# Patient Record
Sex: Female | Born: 1950 | Race: White | Hispanic: No | Marital: Married | Smoking: Former smoker
Health system: Southern US, Community
[De-identification: ages and names within clinical notes are randomized; demographics above are authoritative.]

## PROBLEM LIST (undated history)

## (undated) DIAGNOSIS — Z953 Presence of xenogenic heart valve: Secondary | ICD-10-CM

## (undated) DIAGNOSIS — M797 Fibromyalgia: Secondary | ICD-10-CM

## (undated) DIAGNOSIS — Z87820 Personal history of traumatic brain injury: Secondary | ICD-10-CM

## (undated) DIAGNOSIS — K589 Irritable bowel syndrome without diarrhea: Secondary | ICD-10-CM

## (undated) DIAGNOSIS — Z8719 Personal history of other diseases of the digestive system: Secondary | ICD-10-CM

## (undated) DIAGNOSIS — Z8774 Personal history of (corrected) congenital malformations of heart and circulatory system: Secondary | ICD-10-CM

## (undated) DIAGNOSIS — Z86010 Personal history of colonic polyps: Secondary | ICD-10-CM

## (undated) DIAGNOSIS — K219 Gastro-esophageal reflux disease without esophagitis: Secondary | ICD-10-CM

## (undated) DIAGNOSIS — Z859 Personal history of malignant neoplasm, unspecified: Secondary | ICD-10-CM

## (undated) DIAGNOSIS — N1330 Unspecified hydronephrosis: Secondary | ICD-10-CM

## (undated) DIAGNOSIS — E039 Hypothyroidism, unspecified: Secondary | ICD-10-CM

## (undated) DIAGNOSIS — Z9889 Other specified postprocedural states: Secondary | ICD-10-CM

## (undated) DIAGNOSIS — L309 Dermatitis, unspecified: Secondary | ICD-10-CM

## (undated) DIAGNOSIS — K760 Fatty (change of) liver, not elsewhere classified: Secondary | ICD-10-CM

## (undated) DIAGNOSIS — F418 Other specified anxiety disorders: Secondary | ICD-10-CM

## (undated) DIAGNOSIS — E739 Lactose intolerance, unspecified: Secondary | ICD-10-CM

## (undated) DIAGNOSIS — E785 Hyperlipidemia, unspecified: Secondary | ICD-10-CM

## (undated) DIAGNOSIS — M1990 Unspecified osteoarthritis, unspecified site: Secondary | ICD-10-CM

## (undated) DIAGNOSIS — N200 Calculus of kidney: Secondary | ICD-10-CM

## (undated) DIAGNOSIS — S46001A Unspecified injury of muscle(s) and tendon(s) of the rotator cuff of right shoulder, initial encounter: Secondary | ICD-10-CM

## (undated) HISTORY — PX: TISSUE AORTIC VALVE REPLACEMENT: SHX2527

## (undated) HISTORY — PX: COLONOSCOPY: SHX174

## (undated) HISTORY — DX: Personal history of colonic polyps: Z86.010

## (undated) HISTORY — DX: Irritable bowel syndrome, unspecified: K58.9

## (undated) HISTORY — DX: Hyperlipidemia, unspecified: E78.5

## (undated) HISTORY — PX: TRANSTHORACIC ECHOCARDIOGRAM: SHX275

## (undated) HISTORY — DX: Fatty (change of) liver, not elsewhere classified: K76.0

## (undated) HISTORY — DX: Calculus of kidney: N20.0

## (undated) HISTORY — PX: EXTRACORPOREAL SHOCK WAVE LITHOTRIPSY: SHX1557

## (undated) HISTORY — DX: Gastro-esophageal reflux disease without esophagitis: K21.9

## (undated) HISTORY — PX: CARDIAC CATHETERIZATION: SHX172

## (undated) HISTORY — DX: Fibromyalgia: M79.7

## (undated) HISTORY — PX: ESOPHAGOGASTRODUODENOSCOPY: SHX1529

---
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
## 1978-06-11 HISTORY — PX: INGUINAL HERNIA REPAIR: SUR1180

## 1982-06-11 HISTORY — PX: TOTAL ABDOMINAL HYSTERECTOMY W/ BILATERAL SALPINGOOPHORECTOMY: SHX83

## 1982-06-11 HISTORY — PX: APPENDECTOMY: SHX54

## 1996-06-11 HISTORY — PX: BREAST BIOPSY: SHX20

## 2010-03-11 DIAGNOSIS — I219 Acute myocardial infarction, unspecified: Secondary | ICD-10-CM | POA: Insufficient documentation

## 2010-05-16 ENCOUNTER — Encounter: Payer: Self-pay | Admitting: Cardiology

## 2010-07-05 ENCOUNTER — Encounter: Payer: Self-pay | Admitting: Cardiology

## 2011-05-27 ENCOUNTER — Encounter: Payer: Self-pay | Admitting: Cardiology

## 2011-06-26 ENCOUNTER — Encounter: Payer: Self-pay | Admitting: Cardiology

## 2011-08-22 ENCOUNTER — Encounter: Payer: Self-pay | Admitting: Family Medicine

## 2011-08-22 ENCOUNTER — Ambulatory Visit (INDEPENDENT_AMBULATORY_CARE_PROVIDER_SITE_OTHER): Payer: BC Managed Care – PPO | Admitting: Family Medicine

## 2011-08-22 VITALS — BP 117/74 | HR 64 | Temp 97.8°F | Ht 63.25 in | Wt 193.4 lb

## 2011-08-22 DIAGNOSIS — B309 Viral conjunctivitis, unspecified: Secondary | ICD-10-CM

## 2011-08-22 DIAGNOSIS — E785 Hyperlipidemia, unspecified: Secondary | ICD-10-CM | POA: Insufficient documentation

## 2011-08-22 DIAGNOSIS — E039 Hypothyroidism, unspecified: Secondary | ICD-10-CM

## 2011-08-22 DIAGNOSIS — R0989 Other specified symptoms and signs involving the circulatory and respiratory systems: Secondary | ICD-10-CM

## 2011-08-22 DIAGNOSIS — Z954 Presence of other heart-valve replacement: Secondary | ICD-10-CM

## 2011-08-22 DIAGNOSIS — K219 Gastro-esophageal reflux disease without esophagitis: Secondary | ICD-10-CM | POA: Insufficient documentation

## 2011-08-22 DIAGNOSIS — R0609 Other forms of dyspnea: Secondary | ICD-10-CM

## 2011-08-22 DIAGNOSIS — I10 Essential (primary) hypertension: Secondary | ICD-10-CM

## 2011-08-22 DIAGNOSIS — Z952 Presence of prosthetic heart valve: Secondary | ICD-10-CM

## 2011-08-22 NOTE — Progress Notes (Addendum)
Office Note 08/23/2011  CC:  Chief Complaint  Patient presents with  . Establish Care    new patient  . Conjunctivitis    both eyes    HPI:  Madeline Dyer is a 61 y.o. White female who is here to establish care. Patient's most recent primary MD: Dr. Zachery Dauer in [place]. Old records were not reviewed prior to or during today's visit.  Prob #1: red /bloodshot eyes, some mucous production--onset today.  Mild burning and itching.  No vision complaints.  Reports chronic allergic rhinitis, no acute changes.  No fevers.  Prob #2: Says she has been dx'd with fibromyalgia by her prev MD and is on no treatment.  We agreed to wait on old records before addressing this.  Prob #3: had AVR 2011, says that for the last 10 mo or so she has felt dyspnea with simply walking, doing housework, gardening--routine activity.  Some mile SOB at times even at rest.  Some associated dizziness but no syncope.  No palpitations, no CP, no diaphoresis, no nausea, no LE edema. Has increased 60 lb in wt since AVR surgery due to difficult recovery process/inability to burn calories. 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  Past Medical History  Diagnosis Date  . Hyperlipidemia   . GERD (gastroesophageal reflux disease)   . Thyroid disease   . Allergy   . Cancer     skin  . Myocardial infarction Oct 2011    X 4   . Hx of migraine headaches   . History of aortic valve disease 2011    AVR--bioprosthetic     Past Surgical History  Procedure Date  . Total abdominal hysterectomy w/ bilateral salpingoophorectomy 1984    Nonmalignant reasons  . Cholecystectomy 1984    Lap turned into open  . Breast biopsy 1998    benign  . Tonsillectomy and adenoidectomy 1957  . Cesarean section 1978  .  Tissue aortic valve replacement 2011    [place]  . Skin cancer excision     Squamous cell carcinoma x 2    Family History  Problem Relation Age of Onset  . Heart disease Mother   . Hyperlipidemia Mother   . Hypertension Mother   . Heart disease Father   . Hyperlipidemia Father   . Hypertension Father   . Cancer Maternal Grandmother     breast    History   Social History  . Marital Status: Married    Spouse Name: N/A    Number of Children: N/A  . Years of Education: N/A   Occupational History  . Not on file.   Social History Main Topics  . Smoking status: Former Smoker -- 0.5 packs/day    Types: Cigarettes    Quit date: 06/11/1998  . Smokeless tobacco: Never Used   Comment: smoked off and on since 1970  . Alcohol Use: Yes     2 glasses of wine nightly  . Drug Use: No  . Sexually Active: Yes   Other Topics Concern  . Not on file   Social History Narrative   Married, retired.Relocated to [place] from [place] 2012.Tobacco 15 pack-yr history, quit 2000.Drinks 2 glasses of wine per night.  No hx of drug use.No exercise.    Outpatient Encounter Prescriptions as of 08/22/2011  Medication Sig  Dispense Refill  . ammonium lactate (AMLACTIN) 12 % cream Apply topically as needed.      Marland Kitchen aspirin 81 MG chewable tablet Chew 81 mg by mouth daily.      Marland Kitchen atenolol (TENORMIN) 25 MG tablet Take 25 mg by mouth daily.      Marland Kitchen atorvastatin (LIPITOR) 20 MG tablet Take 20 mg by mouth daily.      . citalopram (CELEXA) 10 MG tablet Take 10 mg by mouth daily.      . clobetasol ointment (TEMOVATE) 0.05 % Apply 1 application topically 2 (two) times daily.      . fexofenadine (ALLEGRA) 180 MG tablet Take 180 mg by mouth daily.      Marland Kitchen levothyroxine (SYNTHROID, LEVOTHROID) 75 MCG tablet Take 75 mcg by mouth daily.      . Multiple Vitamin (MULTIVITAMIN) tablet Take 1 tablet by mouth daily.      . Omega-3 Fatty Acids (FISH OIL) 1200 MG CAPS Take 1 capsule by mouth 2 (two) times daily.        Marland Kitchen omeprazole (PRILOSEC) 40 MG capsule Take 40 mg by mouth daily.        Allergies  Allergen Reactions  . Antihistamines, Diphenhydramine-Type Other (See Comments)    Pt can't recall  . Latex   . Penicillins     Brain swelling    ROS Review of Systems  Constitutional: Negative for fever and fatigue.  HENT: Positive for congestion (nasal, chronic). Negative for sore throat and sinus pressure.   Eyes: Negative for visual disturbance.  Respiratory: Negative for cough and wheezing.   Cardiovascular: Negative for chest pain, palpitations and leg swelling.  Gastrointestinal: Negative for nausea and abdominal pain.  Genitourinary: Negative for dysuria.  Musculoskeletal: Negative for joint swelling.       Diffuse body pain--as stated in HPI pt says she was dx'd with fibromyalgia  Skin: Negative for rash.  Neurological: Negative for weakness and headaches.  Hematological: Negative for adenopathy.  Psychiatric/Behavioral: Negative for dysphoric mood.    PE; Blood pressure 117/74, pulse 64, temperature 97.8 F (36.6 C), temperature source Temporal, height 5' 3.25" (1.607 m), weight 193 lb 6.4 oz (87.726 kg), SpO2 95.00%. Gen: Alert, well appearing.  Patient is oriented to person, place, time, and situation. ENT: Ears: EACs clear, normal epithelium.  TMs with good light reflex and landmarks bilaterally.  Eyes: diffuse injection of bulbar and palpebral conjunctiva, with no soft tissue swelling, no active exudate. Nose: no drainage or turbinate edema/swelling.  No injection or focal lesion.  Mouth: lips without lesion/swelling.  Oral mucosa pink and moist.  Dentition intact and without obvious caries or gingival swelling.  Oropharynx without erythema, exudate, or swelling.  Neck - No masses or thyromegaly or limitation in range of motion CV: RRR, 2-3/6 systolic murmur, S1 obscured, S2 distinct, no diastolic murmur.  No rub or gallop. Chest with symmetric expansion, good aeration, nonlabored  respirations.  Clear and equal breath sounds in all lung fields.  No clubbing or cyanosis. ABD: soft, NT, ND, BS normal.  No hepatospenomegaly or mass.  No bruits. EXT: no clubbing, cyanosis, or edema.    Pertinent labs:  None today  (last labs per pt report were in [place] around spring/summer 2012).  ASSESSMENT AND PLAN:   New pt: obtain old records.  Dyspnea on exertion Chronic, without objective sign of CHF.  She certainly has a component of deconditioning. Has hx of AVR and needs cardiology f/u, echo, and she has this initial appt  set up with [facility] cardiology 09/02/11. Will check CBC, CMET, TSH, and BNP today.  HTN (hypertension), benign Problem stable.  Continue current medications and diet appropriate for this condition.  We have reviewed our general long term plan for this problem and also reviewed symptoms and signs that should prompt the patient to call or return to the office. Check lytes/cr today.  Hypothyroidism Needs routine TSH monitoring: ordered today.  Viral conjunctivitis Self-limited nature of this illness was discussed, questions answered.  Discussed symptomatic care with warm wash cloth soaks prn.  Avoid antihistamine drops b/c of pt report of past rxn to sedating antihistamines.   Warning signs/symptoms of worsening illness were discussed.  Patient instructed to call or return if any of these occur.      Return in about 1 month (around 09/22/2011) for f/u for fibromyalgia.

## 2011-08-23 ENCOUNTER — Encounter: Payer: Self-pay | Admitting: Family Medicine

## 2011-08-23 DIAGNOSIS — E039 Hypothyroidism, unspecified: Secondary | ICD-10-CM | POA: Insufficient documentation

## 2011-08-23 DIAGNOSIS — I10 Essential (primary) hypertension: Secondary | ICD-10-CM | POA: Insufficient documentation

## 2011-08-23 DIAGNOSIS — Z952 Presence of prosthetic heart valve: Secondary | ICD-10-CM | POA: Insufficient documentation

## 2011-08-23 DIAGNOSIS — B309 Viral conjunctivitis, unspecified: Secondary | ICD-10-CM | POA: Insufficient documentation

## 2011-08-23 LAB — COMPREHENSIVE METABOLIC PANEL
ALT: 45 U/L — ABNORMAL HIGH (ref 0–35)
AST: 38 U/L — ABNORMAL HIGH (ref 0–37)
Albumin: 4.6 g/dL (ref 3.5–5.2)
BUN: 13 mg/dL (ref 6–23)
CO2: 24 mEq/L (ref 19–32)
Calcium: 9.9 mg/dL (ref 8.4–10.5)
Chloride: 104 mEq/L (ref 96–112)
Creat: 0.87 mg/dL (ref 0.50–1.10)
Potassium: 4.1 mEq/L (ref 3.5–5.3)

## 2011-08-23 LAB — CBC WITH DIFFERENTIAL/PLATELET
Eosinophils Absolute: 0.4 10*3/uL (ref 0.0–0.7)
Eosinophils Relative: 4 % (ref 0–5)
HCT: 39.9 % (ref 36.0–46.0)
Hemoglobin: 13 g/dL (ref 12.0–15.0)
Lymphocytes Relative: 24 % (ref 12–46)
Lymphs Abs: 2.5 10*3/uL (ref 0.7–4.0)
MCH: 29.6 pg (ref 26.0–34.0)
MCV: 90.9 fL (ref 78.0–100.0)
Monocytes Relative: 6 % (ref 3–12)
RBC: 4.39 MIL/uL (ref 3.87–5.11)
WBC: 10.4 10*3/uL (ref 4.0–10.5)

## 2011-08-23 LAB — BRAIN NATRIURETIC PEPTIDE: Brain Natriuretic Peptide: 99 pg/mL (ref 0.0–100.0)

## 2011-08-23 NOTE — Assessment & Plan Note (Signed)
Self-limited nature of this illness was discussed, questions answered.  Discussed symptomatic care with warm wash cloth soaks prn.  Avoid antihistamine drops b/c of pt report of past rxn to sedating antihistamines.   Warning signs/symptoms of worsening illness were discussed.  Patient instructed to call or return if any of these occur.

## 2011-08-23 NOTE — Assessment & Plan Note (Signed)
Chronic, without objective sign of CHF.  She certainly has a component of deconditioning. Has hx of AVR and needs cardiology f/u, echo, and she has this initial appt set up with [facility] cardiology 09/02/11. Will check CBC, CMET, TSH, and BNP today.

## 2011-08-23 NOTE — Assessment & Plan Note (Signed)
Problem stable.  Continue current medications and diet appropriate for this condition.  We have reviewed our general long term plan for this problem and also reviewed symptoms and signs that should prompt the patient to call or return to the office. Check lytes/cr today.  

## 2011-08-23 NOTE — Assessment & Plan Note (Signed)
Needs routine TSH monitoring: ordered today.

## 2011-08-24 ENCOUNTER — Telehealth: Payer: Self-pay | Admitting: Cardiology

## 2011-08-24 NOTE — Telephone Encounter (Signed)
Records & ROI were Received Via Fax, pt has NP  Appt with [facility] 08/31/11, Records Given to [facility] 08/24/11/KM

## 2011-08-29 ENCOUNTER — Other Ambulatory Visit: Payer: Self-pay | Admitting: Family Medicine

## 2011-08-29 NOTE — Telephone Encounter (Signed)
Clarified with patient she is taking escitalopram-generic for lexapro, not citalopram.  Pt spells med name from bottle.  Med list corrected.  Unable to find [facility] in EPIC list.  RC to patient--line busy x 2.

## 2011-08-31 ENCOUNTER — Encounter: Payer: Self-pay | Admitting: Cardiology

## 2011-08-31 ENCOUNTER — Ambulatory Visit (INDEPENDENT_AMBULATORY_CARE_PROVIDER_SITE_OTHER): Payer: BC Managed Care – PPO | Admitting: Cardiology

## 2011-08-31 VITALS — BP 154/94 | HR 62 | Ht 63.0 in | Wt 192.0 lb

## 2011-08-31 DIAGNOSIS — R0609 Other forms of dyspnea: Secondary | ICD-10-CM

## 2011-08-31 DIAGNOSIS — Z952 Presence of prosthetic heart valve: Secondary | ICD-10-CM

## 2011-08-31 DIAGNOSIS — I359 Nonrheumatic aortic valve disorder, unspecified: Secondary | ICD-10-CM

## 2011-08-31 DIAGNOSIS — E785 Hyperlipidemia, unspecified: Secondary | ICD-10-CM

## 2011-08-31 DIAGNOSIS — I10 Essential (primary) hypertension: Secondary | ICD-10-CM

## 2011-08-31 NOTE — Assessment & Plan Note (Signed)
Continue statin. Lipids and liver monitored by primary care. 

## 2011-08-31 NOTE — Patient Instructions (Addendum)

## 2011-08-31 NOTE — Assessment & Plan Note (Addendum)
This has been present since her aortic valve was replaced. Her dizzy symptoms have also been present since then. Repeat echocardiogram. Patient not volume overloaded on examination. Will obtain previous records concerning cardiac catheterization prior to aortic valve replacement. She was told she did not have blockages and did not require bypass at the time of her surgery.

## 2011-08-31 NOTE — Assessment & Plan Note (Signed)
Blood pressure mildly elevated but she states typically low. We will follow and adjust medications as needed.

## 2011-08-31 NOTE — Progress Notes (Signed)
HPI: 61 year old female with history of aortic valve or placement to establish. Patient underwent aortic valve replacement in November of 2011. She had a bicuspid valve and received a St. Jude 21 mm pericardial valve. Echocardiogram in January of 2012 showed hyperdynamic LV function, mild left ventricular hypertrophy, mild left atrial enlargement and a normal appearing bioprosthetic valve. The mean gradient was 10 mm of mercury. No aortic insufficiency. Patient recently moved to this area and presents to establish. Since her surgery she has noticed dyspnea on exertion. This occurs with more moderate activities. No orthopnea, PND, pedal edema or syncope. She has also had dizziness since her surgery. This increases with eye movements and exertion. She has not had exertional chest pain.  Current Outpatient Prescriptions  Medication Sig Dispense Refill  . ammonium lactate (AMLACTIN) 12 % cream Apply topically as needed.      Marland Kitchen aspirin 81 MG chewable tablet Chew 81 mg by mouth daily.      Marland Kitchen atenolol (TENORMIN) 25 MG tablet Take 25 mg by mouth daily.      Marland Kitchen atorvastatin (LIPITOR) 20 MG tablet Take 20 mg by mouth daily.      . citalopram (CELEXA) 10 MG tablet Take 10 mg by mouth daily.      . clobetasol ointment (TEMOVATE) 0.05 % Apply 1 application topically 2 (two) times daily.      . fexofenadine (ALLEGRA) 180 MG tablet Take 180 mg by mouth daily.      Marland Kitchen levothyroxine (SYNTHROID, LEVOTHROID) 75 MCG tablet Take 75 mcg by mouth daily.      . Multiple Vitamin (MULTIVITAMIN) tablet Take 1 tablet by mouth daily.      . Omega-3 Fatty Acids (FISH OIL) 1200 MG CAPS Take 1 capsule by mouth 2 (two) times daily.      Marland Kitchen omeprazole (PRILOSEC) 40 MG capsule Take 40 mg by mouth daily.        Allergies  Allergen Reactions  . Antihistamines, Diphenhydramine-Type Other (See Comments)    Pt can't recall  . Latex   . Penicillins     Brain swelling    Past Medical History  Diagnosis Date  . Hyperlipidemia   .  GERD (gastroesophageal reflux disease)   . Thyroid disease   . Cancer     skin  . Hx of migraine headaches   . Bicuspid aortic valve 2011    AVR--bioprosthetic   . Nephrolithiasis     Past Surgical History  Procedure Date  . Total abdominal hysterectomy w/ bilateral salpingoophorectomy 1984    Nonmalignant reasons  . Cholecystectomy 1984    Lap turned into open  . Breast biopsy 1998    benign  . Tonsillectomy and adenoidectomy 1957  . Cesarean section 1978  . Tissue aortic valve replacement 2011    [place]  . Skin cancer excision     Squamous cell carcinoma x 2  . Hernia repair     History   Social History  . Marital Status: Married    Spouse Name: N/A    Number of Children: 1  . Years of Education: N/A   Occupational History  . Not on file.   Social History Main Topics  . Smoking status: Former Smoker -- 0.5 packs/day    Types: Cigarettes    Quit date: 06/11/1998  . Smokeless tobacco: Never Used   Comment: smoked off and on since 1970  . Alcohol Use: Yes     2 glasses of wine nightly  . Drug Use: No  .  Sexually Active: Yes   Other Topics Concern  . Not on file   Social History Narrative   Married, retired.Relocated to [place] from [place] 2012.Tobacco 15 pack-yr history, quit 2000.Drinks 2 glasses of wine per night.  No hx of drug use.No exercise.    Family History  Problem Relation Age of Onset  . Heart disease Mother     MI  . Hyperlipidemia Mother   . Hypertension Mother   . Heart disease Father     Aortic aneurysm  . Hyperlipidemia Father   . Hypertension Father   . Cancer Maternal Grandmother     breast    ROS: Dizziness but no fevers or chills, productive cough, hemoptysis, dysphasia, odynophagia, melena, hematochezia, dysuria, hematuria, rash, seizure activity, orthopnea, PND, pedal edema, claudication. Remaining systems are negative.  Physical Exam:   Blood pressure 154/94, pulse 62, height 5\' 3"  (1.6 m), weight 192 lb (87.091  kg).  General:  Well developed/obese in NAD Skin warm/dry Patient not depressed No peripheral clubbing Back-normal HEENT-normal/normal eyelids Neck supple/normal carotid upstroke bilaterally; no bruits; no JVD; no thyromegaly chest - CTA/ normal expansion CV - RRR/normal S1 and S2; no rubs or gallops;  PMI nondisplaced; 3/6 systolic murmur left sternal border. No diastolic murmur. Abdomen -NT/ND, no HSM, no mass, + bowel sounds, no bruit 2+ femoral pulses, no bruits Ext-no edema, chords, 2+ DP Neuro-grossly nonfocal  ECG sinus rhythm at a rate of 62. Nonspecific ST changes.

## 2011-08-31 NOTE — Telephone Encounter (Signed)
Message left for pt to [facility] with pharmacy clarification.

## 2011-08-31 NOTE — Assessment & Plan Note (Signed)
Plan repeat echocardiogram to assess LV function and aortic valve gradients. Discussed SBE prophylaxis and card provided.

## 2011-09-04 ENCOUNTER — Telehealth: Payer: Self-pay | Admitting: Cardiology

## 2011-09-04 MED ORDER — ESCITALOPRAM OXALATE 10 MG PO TABS: 10.0000 mg | ORAL_TABLET | Freq: Every day | ORAL | Status: DC

## 2011-09-04 NOTE — Telephone Encounter (Signed)
ROI faxed to [facility] @ [phone] 09/04/11/KM

## 2011-09-04 NOTE — Telephone Encounter (Signed)
Pharmacy is [facility].  RX sent.

## 2011-09-13 ENCOUNTER — Other Ambulatory Visit: Payer: Self-pay

## 2011-09-13 ENCOUNTER — Ambulatory Visit (HOSPITAL_COMMUNITY): Payer: BC Managed Care – PPO | Attending: Cardiovascular Disease

## 2011-09-13 DIAGNOSIS — I359 Nonrheumatic aortic valve disorder, unspecified: Secondary | ICD-10-CM

## 2011-09-13 DIAGNOSIS — Z954 Presence of other heart-valve replacement: Secondary | ICD-10-CM | POA: Insufficient documentation

## 2011-09-13 DIAGNOSIS — Z952 Presence of prosthetic heart valve: Secondary | ICD-10-CM

## 2011-09-25 ENCOUNTER — Ambulatory Visit: Payer: BC Managed Care – PPO | Admitting: Family Medicine

## 2011-09-26 ENCOUNTER — Ambulatory Visit: Payer: BC Managed Care – PPO | Admitting: Family Medicine

## 2011-09-27 ENCOUNTER — Ambulatory Visit (INDEPENDENT_AMBULATORY_CARE_PROVIDER_SITE_OTHER): Payer: BC Managed Care – PPO | Admitting: Family Medicine

## 2011-09-27 ENCOUNTER — Encounter: Payer: Self-pay | Admitting: Family Medicine

## 2011-09-27 VITALS — BP 120/81 | HR 58 | Temp 97.4°F | Ht 63.25 in | Wt 195.0 lb

## 2011-09-27 DIAGNOSIS — M791 Myalgia, unspecified site: Secondary | ICD-10-CM

## 2011-09-27 DIAGNOSIS — IMO0001 Reserved for inherently not codable concepts without codable children: Secondary | ICD-10-CM

## 2011-09-27 MED ORDER — TRAMADOL HCL 50 MG PO TABS: ORAL_TABLET | ORAL | Status: DC

## 2011-09-27 NOTE — Assessment & Plan Note (Signed)
Chronic, regional for the most part, but definitely with periods of more roving myalgias as well. No sign of arthritis or autoimmune dz. Need old records, but per pt extensive w/u unrevealing, and ultimately dx of fibromyalgia syndrome was given in the distant past. Decided to start tramadol 50-100mg  tid prn for pain, and order referral to Dr. Dierdre Forth in rheumatology for further evaluation and management.

## 2011-09-27 NOTE — Progress Notes (Signed)
OFFICE NOTE  09/27/2011  CC:  Chief Complaint  Patient presents with  . Follow-up    fibromyalgia     HPI: Patient is a 61 y.o. Caucasian female who is here for discussion of fibromyalgia, a diagnosis she reports being given in the distant past (>20 yrs ago).  This is my second time seeing her and we've not yet received any old records from her MD's in [place]. She reports having onset of all her problems after the birth of her son, who is now 71 yrs old. Primary symptom is CONSTANT pain in right side of neck, right trapezius area, and right scapula area.  She feels a definite focus/trigger point in left scapula region, but due to hx of systemic steroid reaction (she has no specifics about this, simply says she cannot and will not take any) she has not been a candidate for trigger point injection in the past.   She also has roving soft tissue pain that comes for a day or so at a time in various areas such as hips, thighs, other portions of neck/back. +Hx of migraine HA's. +Hx of IBS (pt reports full GI w/u in the past was negative). She is high strung, afraid of the side effects of many meds (swears she will not take cymbalta or lyrica, was put on muscle relaxers in the past and says she was too sedated/drugged up on them. Uses ibuprofen and the pain is tolerable some of the time.  Asks for some kind of pain med a little stronger than ibuprofen and also asks for referral to specialist to get opinion on dx again since it has been many years since she was told she likely has fibromyalgia. She reports seeing a neurologist and getting extensive w/u by him and by her PMD in [place], but she says this was years (> decade?) ago.  Pertinent PMH:  Past Medical History  Diagnosis Date  . Hyperlipidemia   . GERD (gastroesophageal reflux disease)   . Thyroid disease   . Cancer     skin  . Hx of migraine headaches   . Bicuspid aortic valve 2011    AVR--bioprosthetic   . Nephrolithiasis     ROS: no joint swelling or redness or stiffness.  No rash except waxing/waning atopic dermatitis No unexplained fevers.  No focal weakness.  Occasional numb "asleep" feeling in thumb and index finger bilat (L>R)--mild and insignificant per pt.    MEDS:  Outpatient Prescriptions Prior to Visit  Medication Sig Dispense Refill  . ammonium lactate (AMLACTIN) 12 % cream Apply topically as needed.      Marland Kitchen aspirin 81 MG chewable tablet Chew 81 mg by mouth daily.      Marland Kitchen atenolol (TENORMIN) 25 MG tablet Take 25 mg by mouth daily.      Marland Kitchen atorvastatin (LIPITOR) 20 MG tablet Take 20 mg by mouth daily.      . clobetasol ointment (TEMOVATE) 0.05 % Apply 1 application topically 2 (two) times daily.      Marland Kitchen escitalopram (LEXAPRO) 10 MG tablet Take 1 tablet (10 mg total) by mouth daily.  90 tablet  1  . fexofenadine (ALLEGRA) 180 MG tablet Take 180 mg by mouth daily.      Marland Kitchen levothyroxine (SYNTHROID, LEVOTHROID) 75 MCG tablet Take 75 mcg by mouth daily.      . Multiple Vitamin (MULTIVITAMIN) tablet Take 1 tablet by mouth daily.      . Omega-3 Fatty Acids (FISH OIL) 1200 MG CAPS Take 1  capsule by mouth 2 (two) times daily.      Marland Kitchen omeprazole (PRILOSEC) 40 MG capsule Take 40 mg by mouth daily.        PE: Blood pressure 120/81, pulse 58, temperature 97.4 F (36.3 C), temperature source Temporal, height 5' 3.25" (1.607 m), weight 195 lb (88.451 kg). Gen: Alert, well appearing, obese white female.  Patient is oriented to person, place, time, and situation. No facial rash. Musculoskeletal: no joint swelling, erythema, warmth, or tenderness.  ROM of all joints intact. She has TTP in right sided soft tissues of neck, right trapezius out to the acromion region, and right scapular area, with a focal point of tenderness in scapula (but I cannot feel any focal soft tissue abnormality here). Strength 5/5 prox/dist muscles in UE's and LE's bilat.  IMPRESSION AND PLAN:  Myalgia Chronic, regional for the most part, but  definitely with periods of more roving myalgias as well. No sign of arthritis or autoimmune dz. Need old records, but per pt extensive w/u unrevealing, and ultimately dx of fibromyalgia syndrome was given in the distant past. Decided to start tramadol 50-100mg  tid prn for pain, and order referral to Dr. Dierdre Forth in rheumatology for further evaluation and management.      FOLLOW UP: 43mo

## 2011-10-12 NOTE — Telephone Encounter (Signed)
Records Received From [facility]  Gave to [place] 10/12/11/KM

## 2011-10-30 ENCOUNTER — Other Ambulatory Visit: Payer: Self-pay | Admitting: Family Medicine

## 2011-10-30 DIAGNOSIS — M791 Myalgia, unspecified site: Secondary | ICD-10-CM

## 2011-10-30 MED ORDER — TRAMADOL HCL 50 MG PO TABS: 50.0000 mg | ORAL_TABLET | Freq: Three times a day (TID) | ORAL | Status: DC | PRN

## 2011-10-30 NOTE — Telephone Encounter (Signed)
Refill request for tramadol (30 and 90 day) Last filled-09/27/11, #60 x 0 Last seen-09/27/11 Follow up -2 months on 11/27/11.  Referral in process for Dr. Dierdre Forth for Myalgias. Please advise if 30 day supply OK and I will route to Dr. Milinda Cave for 90 day approval.

## 2011-10-30 NOTE — Telephone Encounter (Signed)
Patient is requesting 30 day supply of Tramadol to be sent to [facility] & a 90 day supply to mail order. Patient is out of her meds, can she get Rx this afternoon?

## 2011-10-30 NOTE — Telephone Encounter (Signed)
Verbal per Dr. Abner Greenspan, [facility] to fill #[phone] as given before.  Note routed to Dr. Milinda Cave for approval of 90 day supply.

## 2011-11-01 NOTE — Telephone Encounter (Signed)
Dr. Milinda Cave, please advise if 90-day RX appropriate.  Thanks.

## 2011-11-01 NOTE — Telephone Encounter (Signed)
Please notify pt that I will not do a 90 day supply of this med.  I will do 30d supply at a time and eventually I will do RFs on the med but we haven't even really determined what her 30 day supply should be b/c I just started her on it last visit and she hasn't followed up yet.-thx

## 2011-11-15 ENCOUNTER — Telehealth: Payer: Self-pay | Admitting: Family Medicine

## 2011-11-15 NOTE — Telephone Encounter (Signed)
Left a message for patient to return my call. 

## 2011-11-15 NOTE — Telephone Encounter (Signed)
The only potential for interaction on her med list is lexapro, but these meds CAN be taken together with caution and we do it all the time.  The risk of problems between these two meds is rare, and I still recommend she fill the med (at her local pharmacy if mail order refuses).  If this doesn't address her concern adequately then encourage her to come in to the office to discuss at an office visit.

## 2011-11-16 MED ORDER — ATENOLOL 25 MG PO TABS: 25.0000 mg | ORAL_TABLET | Freq: Every day | ORAL | Status: DC

## 2011-11-16 MED ORDER — LEVOTHYROXINE SODIUM 75 MCG PO TABS: 75.0000 ug | ORAL_TABLET | Freq: Every day | ORAL | Status: DC

## 2011-11-16 MED ORDER — OMEPRAZOLE 40 MG PO CPDR: 40.0000 mg | DELAYED_RELEASE_CAPSULE | Freq: Every day | ORAL | Status: DC

## 2011-11-16 NOTE — Telephone Encounter (Signed)
Pt informed and stated that she understood. Pt will try to get the RX switched.

## 2011-11-16 NOTE — Telephone Encounter (Signed)
Patient is returning Christy's call.

## 2011-11-16 NOTE — Telephone Encounter (Signed)
Needs meds refilled: Omeprazole 40mg , Atenolol 25 mg, Synthroid 75 mg.

## 2011-11-22 ENCOUNTER — Encounter: Payer: Self-pay | Admitting: Cardiology

## 2011-11-23 ENCOUNTER — Telehealth: Payer: Self-pay | Admitting: *Deleted

## 2011-11-23 DIAGNOSIS — M791 Myalgia, unspecified site: Secondary | ICD-10-CM

## 2011-11-23 NOTE — Telephone Encounter (Signed)
Pt states [facility] will still not release tramadol stating it interacts with her synthroid.  She states we will need to call [facility] to override.  She is unable to get at local pharmacy.  Insurance will not pay for anymore through local pharm. Pt also needs refill on lexapro-[facility].

## 2011-11-26 MED ORDER — TRAMADOL HCL 50 MG PO TABS: 50.0000 mg | ORAL_TABLET | Freq: Three times a day (TID) | ORAL | Status: DC | PRN

## 2011-11-26 MED ORDER — ESCITALOPRAM OXALATE 10 MG PO TABS: 10.0000 mg | ORAL_TABLET | Freq: Every day | ORAL | Status: DC

## 2011-11-26 NOTE — Telephone Encounter (Signed)
RX sent to [facility] for lexapro.  Tramadol issue was addressed earlier this month and decision was made to not fill Tramadol via mail order as we do not know what her dose will be.  Pt states insurance will not pay for refills through local pahrmacy.  Please advise regarding 90 day through pharmacy.

## 2011-11-26 NOTE — Telephone Encounter (Signed)
Rx done to mail order pharmacy for #180 (assuming she takes max of 2 tabs tid, this is a 1 month supply).

## 2011-11-27 ENCOUNTER — Ambulatory Visit: Payer: BC Managed Care – PPO | Admitting: Family Medicine

## 2011-12-07 ENCOUNTER — Other Ambulatory Visit: Payer: Self-pay | Admitting: Family Medicine

## 2011-12-07 ENCOUNTER — Other Ambulatory Visit (INDEPENDENT_AMBULATORY_CARE_PROVIDER_SITE_OTHER): Payer: BC Managed Care – PPO

## 2011-12-07 DIAGNOSIS — M2550 Pain in unspecified joint: Secondary | ICD-10-CM

## 2011-12-07 DIAGNOSIS — IMO0001 Reserved for inherently not codable concepts without codable children: Secondary | ICD-10-CM

## 2011-12-07 DIAGNOSIS — Z79899 Other long term (current) drug therapy: Secondary | ICD-10-CM

## 2011-12-07 DIAGNOSIS — R5381 Other malaise: Secondary | ICD-10-CM

## 2011-12-07 LAB — CBC WITH DIFFERENTIAL/PLATELET
Basophils Absolute: 0.1 10*3/uL (ref 0.0–0.1)
Eosinophils Relative: 3 % (ref 0–5)
HCT: 38.7 % (ref 36.0–46.0)
Hemoglobin: 13.7 g/dL (ref 12.0–15.0)
Lymphocytes Relative: 26 % (ref 12–46)
Lymphs Abs: 2.1 10*3/uL (ref 0.7–4.0)
MCV: 87.2 fL (ref 78.0–100.0)
Monocytes Absolute: 0.5 10*3/uL (ref 0.1–1.0)
Neutro Abs: 5.2 10*3/uL (ref 1.7–7.7)
RBC: 4.44 MIL/uL (ref 3.87–5.11)
RDW: 13.6 % (ref 11.5–15.5)
WBC: 8.1 10*3/uL (ref 4.0–10.5)

## 2011-12-07 LAB — COMPREHENSIVE METABOLIC PANEL
Albumin: 4.4 g/dL (ref 3.5–5.2)
CO2: 27 mEq/L (ref 19–32)
Calcium: 9.7 mg/dL (ref 8.4–10.5)
Chloride: 102 mEq/L (ref 96–112)
Glucose, Bld: 76 mg/dL (ref 70–99)
Potassium: 4.3 mEq/L (ref 3.5–5.3)
Sodium: 140 mEq/L (ref 135–145)
Total Protein: 6.5 g/dL (ref 6.0–8.3)

## 2011-12-07 LAB — SEDIMENTATION RATE: Sed Rate: 5 mm/hr (ref 0–22)

## 2011-12-07 LAB — TSH: TSH: 3.604 u[IU]/mL (ref 0.350–4.500)

## 2011-12-07 MED ORDER — ATORVASTATIN CALCIUM 20 MG PO TABS: 20.0000 mg | ORAL_TABLET | Freq: Every day | ORAL | Status: DC

## 2011-12-07 NOTE — Telephone Encounter (Signed)
Atorvastatin 90 day supply to mail order pharmacy, thanks

## 2011-12-07 NOTE — Telephone Encounter (Signed)
Pt request 90 day supply of atorvastatin.  RX sent.

## 2011-12-08 LAB — CK TOTAL AND CKMB (NOT AT ARMC): CK, MB: 1.3 ng/mL (ref 0.3–4.0)

## 2011-12-08 LAB — VITAMIN D 25 HYDROXY (VIT D DEFICIENCY, FRACTURES): Vit D, 25-Hydroxy: 56 ng/mL (ref 30–89)

## 2011-12-10 LAB — ANA: Anti Nuclear Antibody(ANA): NEGATIVE

## 2011-12-11 LAB — PROTEIN ELECTROPHORESIS, SERUM, WITH REFLEX
Albumin ELP: 61.5 % (ref 55.8–66.1)
Alpha-1-Globulin: 5.1 % — ABNORMAL HIGH (ref 2.9–4.9)
Beta 2: 4.9 % (ref 3.2–6.5)
Beta Globulin: 6.8 % (ref 4.7–7.2)
Gamma Globulin: 10.3 % — ABNORMAL LOW (ref 11.1–18.8)

## 2011-12-18 ENCOUNTER — Ambulatory Visit: Payer: BC Managed Care – PPO | Admitting: Family Medicine

## 2011-12-19 ENCOUNTER — Encounter: Payer: Self-pay | Admitting: Family Medicine

## 2012-01-16 ENCOUNTER — Other Ambulatory Visit: Payer: Self-pay | Admitting: *Deleted

## 2012-01-16 MED ORDER — ATENOLOL 25 MG PO TABS: 25.0000 mg | ORAL_TABLET | Freq: Every day | ORAL | Status: DC

## 2012-01-16 MED ORDER — SYNTHROID 75 MCG PO TABS: 75.0000 ug | ORAL_TABLET | Freq: Every day | ORAL | Status: DC

## 2012-01-16 NOTE — Telephone Encounter (Signed)
Refill request for 90 day atenolol and brand synthroid Last filled-11/2011 Last seen-09/27/11 Follow up -needed in 2 months for myalgias.  Pt is seeing Dr. Link Snuffer.  TSH current.

## 2012-02-08 ENCOUNTER — Other Ambulatory Visit: Payer: Self-pay | Admitting: *Deleted

## 2012-02-08 MED ORDER — OMEPRAZOLE 40 MG PO CPDR: 40.0000 mg | DELAYED_RELEASE_CAPSULE | Freq: Every day | ORAL | Status: DC

## 2012-02-08 NOTE — Telephone Encounter (Signed)
Refill request for OMEPRAZOLE 90 DAY Last filled- 11/16/11, #90 X 0 Last seen- 09/27/11 RX sent.

## 2012-04-18 ENCOUNTER — Other Ambulatory Visit: Payer: Self-pay | Admitting: *Deleted

## 2012-04-18 MED ORDER — ESCITALOPRAM OXALATE 10 MG PO TABS: 10.0000 mg | ORAL_TABLET | Freq: Every day | ORAL | Status: DC

## 2012-04-18 MED ORDER — ATORVASTATIN CALCIUM 20 MG PO TABS: 20.0000 mg | ORAL_TABLET | Freq: Every day | ORAL | Status: DC

## 2012-04-18 NOTE — Telephone Encounter (Signed)
Faxed refill request received from pharmacy for 90 DAY ATORVASTATIN Last filled by MD on 12/07/11, 90 x 1  Faxed refill request received from pharmacy for [facility] Last filled by MD on 11/26/11, #90 x 1 Last seen on 09/27/11 Follow up 2 MONTHS, CANCELLED APPT 1 refill sent.  Pt needs office visit.

## 2012-04-29 ENCOUNTER — Ambulatory Visit (INDEPENDENT_AMBULATORY_CARE_PROVIDER_SITE_OTHER): Payer: BC Managed Care – PPO | Admitting: Family Medicine

## 2012-04-29 ENCOUNTER — Encounter: Payer: Self-pay | Admitting: Family Medicine

## 2012-04-29 VITALS — BP 126/84 | HR 66 | Temp 98.6°F | Ht 63.25 in | Wt 191.0 lb

## 2012-04-29 DIAGNOSIS — M25519 Pain in unspecified shoulder: Secondary | ICD-10-CM

## 2012-04-29 DIAGNOSIS — F419 Anxiety disorder, unspecified: Secondary | ICD-10-CM

## 2012-04-29 DIAGNOSIS — IMO0001 Reserved for inherently not codable concepts without codable children: Secondary | ICD-10-CM

## 2012-04-29 DIAGNOSIS — Z860100 Personal history of colon polyps, unspecified: Secondary | ICD-10-CM

## 2012-04-29 DIAGNOSIS — M25511 Pain in right shoulder: Secondary | ICD-10-CM

## 2012-04-29 DIAGNOSIS — F411 Generalized anxiety disorder: Secondary | ICD-10-CM

## 2012-04-29 DIAGNOSIS — M791 Myalgia, unspecified site: Secondary | ICD-10-CM

## 2012-04-29 DIAGNOSIS — Z23 Encounter for immunization: Secondary | ICD-10-CM

## 2012-04-29 DIAGNOSIS — K625 Hemorrhage of anus and rectum: Secondary | ICD-10-CM

## 2012-04-29 MED ORDER — OMEPRAZOLE 40 MG PO CPDR: 40.0000 mg | DELAYED_RELEASE_CAPSULE | Freq: Every day | ORAL | Status: DC

## 2012-04-29 MED ORDER — ESCITALOPRAM OXALATE 20 MG PO TABS: 20.0000 mg | ORAL_TABLET | Freq: Every day | ORAL | Status: DC

## 2012-04-29 MED ORDER — TRAMADOL HCL 50 MG PO TABS: 50.0000 mg | ORAL_TABLET | Freq: Three times a day (TID) | ORAL | Status: DC | PRN

## 2012-04-29 NOTE — Assessment & Plan Note (Signed)
Posterior soft tissue pain/tenderness is her typical fibromyalgai pain. The other shoulder subjective and objective findings are worrisome for rotator cuff tear and/or labrum tear. Will obtain MRI right shoulder.

## 2012-04-29 NOTE — Progress Notes (Signed)
OFFICE NOTE  04/29/2012  CC:  Chief Complaint  Patient presents with  . Advice Only    wants to talk to doctor, will not say why     HPI: Patient is a 61 y.o. Caucasian female who is here for "needs to talk".  Basically describes ongoing fibromyalgia sx's unchanged and has f/u soon with Dr. Consuella Lose but is not sure if she needs to continue with her b/c not much new is being done and nothing is changing. She is very bothered by her right shoulder pain, which she says is different than her chronic fibro pain.  She reports at least one year of severe right shoulder pain, feels internal and like it is stabbing down into deltoid region at times.  Hurts to abduct, describes impaired rotation, can't sleep due to pain.  Tramadol doesn't help. Denies tingling or numbness or distinct weakness (except secondary to pain).  She has chronic neck soft tissue pain, usually diminished neck ROM, but denies any connection between neck movement and worsening of the right shoulder.  She got plain film by Dr. Consuella Lose about 6 mo ago but patient doesn't recall ever getting any other imaging done on shoulder.  She has never had an injection in shoulder b/c she is "allergic" to steroid injections.  Simply says that in the distant past she had a knee problem that a doctor did a steroid injection for and "I got deathly ill and it caused me to miscarry my twins".  She cannot describe what she specifically means by deathy ill.   She describes extreme frustration over her health situation, says it is causing her to cuss and feel constantly anxious, says she doesn't want to pursue disability b/c she wants to contribute to society.  Also relates that her mother passed away in [place] since I last saw her.  She then broke down crying. She asksed for local GI referral: she has hx of adenomatous colon polyps on prior colonoscopies in [place] and recently received a recall letter notifying her that it was time for repeat  colonoscopy.  She does say she has been having some intermittent painless BRBPR off and on for a few months now, sometimes associated with a little bit of pelvic pain with BM. She is worried about eye sx's she's had recently: has brief periods during which she gets "squiggly black areas" in visual field of usually just one eye (it can be right OR left) but on rare occasion she has had it in both at same time.  No dizziness, no eye pain, no HA associated.  No trigger.  Lasts 20 seconds to a minute.  She says she recently saw an ophthalmologist and was told that her eyes look fine.  Pertinent PMH:  Past Medical History  Diagnosis Date  . Hyperlipidemia   . GERD (gastroesophageal reflux disease)   . Thyroid disease   . Cancer     skin  . Hx of migraine headaches   . Bicuspid aortic valve 2011    AVR--bioprosthetic   . Nephrolithiasis   . Fibromyalgia syndrome     Dr. Corliss Skains 12/2011  . IBS (irritable bowel syndrome)     MEDS:  Outpatient Prescriptions Prior to Visit  Medication Sig Dispense Refill  . ammonium lactate (AMLACTIN) 12 % cream Apply topically as needed.      Marland Kitchen aspirin 81 MG chewable tablet Chew 81 mg by mouth daily.      Marland Kitchen atenolol (TENORMIN) 25 MG tablet Take 1 tablet (25  mg total) by mouth daily.  90 tablet  1  . atorvastatin (LIPITOR) 20 MG tablet Take 1 tablet (20 mg total) by mouth daily.  90 tablet  0  . cholecalciferol (VITAMIN D) 1000 UNITS tablet Take 1,000 Units by mouth daily.      . clobetasol ointment (TEMOVATE) 0.05 % Apply 1 application topically 2 (two) times daily.      Marland Kitchen escitalopram (LEXAPRO) 10 MG tablet Take 1 tablet (10 mg total) by mouth daily.  90 tablet  0  . fexofenadine (ALLEGRA) 180 MG tablet Take 180 mg by mouth daily.      . Multiple Vitamin (MULTIVITAMIN) tablet Take 1 tablet by mouth daily.      . Omega-3 Fatty Acids (FISH OIL) 1200 MG CAPS Take 1 capsule by mouth 2 (two) times daily.      Marland Kitchen omeprazole (PRILOSEC) 40 MG capsule Take 1 capsule  (40 mg total) by mouth daily.  90 capsule  3  . SYNTHROID 75 MCG tablet Take 1 tablet (75 mcg total) by mouth daily.  90 tablet  1  . traMADol (ULTRAM) 50 MG tablet Take 1-2 tablets (50-100 mg total) by mouth 3 (three) times daily as needed for pain.  180 tablet  0   Last reviewed on 04/29/2012 11:15 AM by Jeoffrey Massed, MD  PE: Blood pressure 126/84, pulse 66, temperature 98.6 F (37 C), temperature source Temporal, height 5' 3.25" (1.607 m), weight 191 lb (86.637 kg). Gen: Alert, well appearing.  Patient is oriented to person, place, time, and situation. AFFECT: pleasant, lucid thought and speech.  She briefly broke down and cried when telling me about her mother's passing, but collected herself appropriately and was fine thereafter. CV: RRR, 2-3/6 systolic murmur, no diastolic murmur.    LUNGS: CTA bilat, nonlabored resps, good aeration in all lung fields. Right shoulder: Tender over trapezius and rhomboid area --a few focal tender/trigger points were notable. No tenderness of clavicle, AC joint, or around/under acromion.  She can only abduct her right shoulder to 45 degrees until the pain is incapacitating.  She tries to drop that arm slowly from the abducted position and cannot do it without dropping it to the side b/c of severe pain.  IR causes pain but ER causes minimal discomfort.  +O'brien's test.  Spurling's test negative.  UE strength--5-/5 on right and 5/5 on left.  LAB: none today  IMPRESSION AND PLAN:  Hx of adenomatous colonic polyps Will refer to local GI MD since she says she is due for repeat AND she has had intermittent painless rectal bleeding the last few months.  Shoulder pain, right Posterior soft tissue pain/tenderness is her typical fibromyalgai pain. The other shoulder subjective and objective findings are worrisome for rotator cuff tear and/or labrum tear. Will obtain MRI right shoulder.  Anxiety Multifactorial etiology: chronic illness, fibromyalgia, family  stress. Not well controlled currently so we pushed her lexapro dose up from 10 mg to 20mg  qd.   Flu vaccine IM today.  An After Visit Summary was printed and given to the patient.  FOLLOW UP: 2mo

## 2012-04-29 NOTE — Assessment & Plan Note (Signed)
Multifactorial etiology: chronic illness, fibromyalgia, family stress. Not well controlled currently so we pushed her lexapro dose up from 10 mg to 20mg  qd.

## 2012-04-29 NOTE — Assessment & Plan Note (Signed)
Will refer to local GI MD since she says she is due for repeat AND she has had intermittent painless rectal bleeding the last few months.

## 2012-04-30 ENCOUNTER — Encounter: Payer: Self-pay | Admitting: Internal Medicine

## 2012-05-02 ENCOUNTER — Ambulatory Visit
Admission: RE | Admit: 2012-05-02 | Discharge: 2012-05-02 | Disposition: A | Discharge status: Home / Self Care | Payer: BC Managed Care – PPO | Source: Ambulatory Visit | Attending: Family Medicine | Admitting: Family Medicine

## 2012-05-02 DIAGNOSIS — M25511 Pain in right shoulder: Secondary | ICD-10-CM

## 2012-05-06 ENCOUNTER — Telehealth: Payer: Self-pay | Admitting: Family Medicine

## 2012-05-06 NOTE — Telephone Encounter (Signed)
Please call patient

## 2012-05-06 NOTE — Telephone Encounter (Signed)
PC taken care of in result note.  Pt is returning my call.

## 2012-05-07 ENCOUNTER — Other Ambulatory Visit: Payer: Self-pay | Admitting: Family Medicine

## 2012-05-07 DIAGNOSIS — S43439A Superior glenoid labrum lesion of unspecified shoulder, initial encounter: Secondary | ICD-10-CM

## 2012-05-07 DIAGNOSIS — M7580 Other shoulder lesions, unspecified shoulder: Secondary | ICD-10-CM

## 2012-05-07 DIAGNOSIS — M19019 Primary osteoarthritis, unspecified shoulder: Secondary | ICD-10-CM

## 2012-05-07 NOTE — Progress Notes (Signed)
OK.  Referral ordered --Dr. Cleophas Dunker should work nicely for her.-thx

## 2012-05-27 ENCOUNTER — Ambulatory Visit (AMBULATORY_SURGERY_CENTER): Payer: BC Managed Care – PPO

## 2012-05-27 VITALS — Ht 63.0 in | Wt 192.6 lb

## 2012-05-27 DIAGNOSIS — Z1211 Encounter for screening for malignant neoplasm of colon: Secondary | ICD-10-CM

## 2012-05-27 DIAGNOSIS — Z860100 Personal history of colon polyps, unspecified: Secondary | ICD-10-CM

## 2012-05-27 DIAGNOSIS — Z800 Family history of malignant neoplasm of digestive organs: Secondary | ICD-10-CM

## 2012-05-27 NOTE — Progress Notes (Signed)
Pt came into the office for her pre-visit prior to her colonoscopy with Dr. Leone Payor on 06/19/12. Due to a complicated medical history and problems with blacking out with sedation, the pt was scheduled for an office visit with Dr Leone Payor on 06/24/12.The colonoscopy appt on 06/19/12 was cancelled. Pt understood.

## 2012-05-29 ENCOUNTER — Ambulatory Visit: Payer: BC Managed Care – PPO | Admitting: Family Medicine

## 2012-06-19 ENCOUNTER — Encounter: Payer: BC Managed Care – PPO | Admitting: Internal Medicine

## 2012-06-23 ENCOUNTER — Other Ambulatory Visit: Payer: Self-pay | Admitting: *Deleted

## 2012-06-23 DIAGNOSIS — M791 Myalgia, unspecified site: Secondary | ICD-10-CM

## 2012-06-23 MED ORDER — TRAMADOL HCL 50 MG PO TABS: 50.0000 mg | ORAL_TABLET | Freq: Three times a day (TID) | ORAL | Status: DC | PRN

## 2012-06-23 MED ORDER — ATORVASTATIN CALCIUM 20 MG PO TABS: 20.0000 mg | ORAL_TABLET | Freq: Every day | ORAL | Status: DC

## 2012-06-23 NOTE — Telephone Encounter (Signed)
Refill request for ATORVASTATIN-90 DAY Last seen- 04/29/12 Follow up - needed in one month, no appt in system Refill sent per [facility] refill protocol.  Refill request for TRAMADOL-90 DAY SUPPLY TO MAILORDER Last filled- 04/29/12, #180 X 0 Last seen- 04/29/12 Follow up - one month, no appt in system Please advise refills.

## 2012-06-23 NOTE — Telephone Encounter (Signed)
Approved rF of ultram, #180, no RF. Needs routine f/u appt in office prior to her NEXT RF request for this.-thx

## 2012-06-24 ENCOUNTER — Encounter: Payer: Self-pay | Admitting: Internal Medicine

## 2012-06-24 ENCOUNTER — Ambulatory Visit (INDEPENDENT_AMBULATORY_CARE_PROVIDER_SITE_OTHER): Payer: BC Managed Care – PPO | Admitting: Internal Medicine

## 2012-06-24 VITALS — BP 124/66 | HR 78 | Ht 63.5 in | Wt 193.0 lb

## 2012-06-24 DIAGNOSIS — Z1211 Encounter for screening for malignant neoplasm of colon: Secondary | ICD-10-CM

## 2012-06-24 NOTE — Patient Instructions (Addendum)
We will contact you once we receive your records from Dr. Baldo Ash D'Angelo's office.  If you have not heard from [place] within two weeks call us back.  Sorry for your inconvenience today.  Thank you for choosing me and [facility].  Iva Boop, M.D., [facility]

## 2012-06-24 NOTE — Progress Notes (Addendum)
Subjective:    Patient ID: Madeline Dyer, female    DOB: 1951-02-10, 62 y.o.   MRN: [id number]  HPI This is a very nice 62 year old woman who might be due for a colonoscopy. She think she probably is. She had a colonoscopy in [place] a number of years ago. She is not really sure she had polyps or not. Her husband recently received a letter from the same practice in [place] that he was due and is scheduled for colonoscopy tomorrow. The patient has not received any latter.  She was seen by her nursing staff are direct procedure and reported some issues with anesthesia, the nurse thought that maybe she had some sort of black out problems and the thought that she had a complicated medical history so an office visit was scheduled. As it turns out the patient has had problems after surgeries, and was awake but unable to move her muscles after general anesthesia. On the colonoscopy she has had, she may have had 2 of these, she said the sedation was perfect and she had no problems.   She has chronic loose stools with IBS that is stable.  She is having shoulder and neck problems from arthritis and degenerative disc disease. Allergies  Allergen Reactions  . Antihistamines, Diphenhydramine-Type Other (See Comments)    Pt can't recall  . Latex   . Penicillins     Brain swelling  . Prednisone Other (See Comments)    Unclear rxn to systemic steroids in the past and she will not take them again.   Outpatient Prescriptions Prior to Visit  Medication Sig Dispense Refill  . ammonium lactate (AMLACTIN) 12 % cream Apply topically as needed.      Marland Kitchen aspirin 81 MG chewable tablet Chew 81 mg by mouth daily.      Marland Kitchen atenolol (TENORMIN) 25 MG tablet Take 1 tablet (25 mg total) by mouth daily.  90 tablet  1  . atorvastatin (LIPITOR) 20 MG tablet Take 1 tablet (20 mg total) by mouth daily.  90 tablet  1  . clobetasol ointment (TEMOVATE) 0.05 % Apply 1 application topically 2 (two) times daily.        Marland Kitchen escitalopram (LEXAPRO) 20 MG tablet Take 1 tablet (20 mg total) by mouth daily.  90 tablet  3  . fexofenadine (ALLEGRA) 180 MG tablet Take 180 mg by mouth daily.      . Multiple Vitamin (MULTIVITAMIN) tablet Take 1 tablet by mouth daily.      . Omega-3 Fatty Acids (FISH OIL) 1200 MG CAPS Take 1 capsule by mouth 2 (two) times daily.      Marland Kitchen omeprazole (PRILOSEC) 40 MG capsule Take 1 capsule (40 mg total) by mouth daily.  90 capsule  3  . SYNTHROID 75 MCG tablet Take 1 tablet (75 mcg total) by mouth daily.  90 tablet  1  . traMADol (ULTRAM) 50 MG tablet Take 1-2 tablets (50-100 mg total) by mouth 3 (three) times daily as needed for pain.  180 tablet  0  . [DISCONTINUED] cholecalciferol (VITAMIN D) 1000 UNITS tablet Take 1,000 Units by mouth daily.       Last reviewed on 06/24/2012  2:08 PM by Iva Boop, MD Past Medical History  Diagnosis Date  . Hyperlipidemia   . GERD (gastroesophageal reflux disease)   . Thyroid disease   . Cancer     skin  . Hx of migraine headaches   . Bicuspid aortic valve 2011    AVR--bioprosthetic   .  Nephrolithiasis   . Fibromyalgia syndrome     Dr. Corliss Skains 12/2011  . IBS (irritable bowel syndrome)   . Hx of adenomatous colonic polyps     Colonoscopy x 2 in [place].   Past Surgical History  Procedure Date  . Total abdominal hysterectomy w/ bilateral salpingoophorectomy 1984    Nonmalignant reasons  . Cholecystectomy 1984    Lap turned into open  . Breast biopsy 1998    benign  . Tonsillectomy and adenoidectomy 1957  . Cesarean section 1978  . Tissue aortic valve replacement 2011    [place]  . Skin cancer excision     Squamous cell carcinoma x 2  . Hernia repair   . Skin cancer excision     FACE   History   Social History  . Marital Status: Married    Spouse Name: N/A    Number of Children: 1  . Years of Education: N/A   Occupational History  . Homemaker    Social History Main Topics  . Smoking status: Former Smoker --  0.5 packs/day    Types: Cigarettes    Quit date: 06/11/1998  . Smokeless tobacco: Never Used     Comment: smoked off and on since 1970  . Alcohol Use: 6.0 oz/week    10 Glasses of wine per week     Comment: 2 glasses of wine nightly  . Drug Use: No  . Sexually Active: Yes   Other Topics Concern  . None   Social History Narrative   Married, retired.Relocated to [place] from [place] 2012.Tobacco 15 pack-yr history, quit 2000.Drinks 2 glasses of wine per night.  No hx of drug use.No exercise.   Family History  Problem Relation Age of Onset  . Heart disease Mother     MI  . Hyperlipidemia Mother   . Hypertension Mother   . Heart disease Father     Aortic aneurysm  . Hyperlipidemia Father   . Hypertension Father   . Breast cancer Maternal Grandmother         Review of Systems As above    Objective:   Physical Exam Well-developed middle-aged white woman in no acute distress Lungs are clear anteriorly Heart shows a grade 2-3/6 systolic ejection murmur.       Assessment & Plan:   It may be time for a repeat colonoscopy, we are not sure. He understands that it is appropriate to be accurate about this. We have requested the records from [place] to better understand this. Once she received those, we'll be able to appropriately schedule her.  I apologized about the inconvenience of the office visit and not having the records today. She will not be charged for this visit. Unfortunately, we will not be able to give her preparation instructions at this time because there is paperwork it must be recorded with a date etc. and she will have to return.  I appreciate the opportunity to care for this patient.  CC: MCGOWEN,PHILIP H, MD   07/11/2012  Outside records review:  EGD 08/13/06 Normal with normal duodenal bxs Colonoscopy 08/09/2006 - NormalEGD 1 2003 and 2008 Abd [place] normal 06/2001 - mild gastritis 04/2002 colonoscopy normal    She does not need a routine  colonoscopy until about 07/2016. We will call her and also update Health Maintenance

## 2012-07-11 ENCOUNTER — Encounter: Payer: Self-pay | Admitting: Internal Medicine

## 2012-07-14 ENCOUNTER — Telehealth: Payer: Self-pay

## 2012-07-14 NOTE — Telephone Encounter (Signed)
Message copied by Annett Fabian on Mon Jul 14, 2012  1:22 PM ------      Message from: Iva Boop      Created: Fri Jul 11, 2012  5:03 PM      Regarding: 07/2016 colon recall       Let her know we got records from [place]            Colonoscopy 2003 and 2008 were normal so next one due 07/2016 about

## 2012-07-14 NOTE — Telephone Encounter (Signed)
Patient advised.  Recall entered for 07/2016

## 2012-07-29 ENCOUNTER — Other Ambulatory Visit: Payer: Self-pay | Admitting: *Deleted

## 2012-07-29 NOTE — Telephone Encounter (Signed)
Refill request for ATENOLOL Last filled-01/16/12, #90 X 1 Last seen- 04/29/12 Follow up - needed in one month, no appt scheduled Refill sent per [facility] refill protocol.  Refill request for SYNTHROID Last filled- 01/16/12, #90 X 1 Pt last received brand synthroid.  Message left for pt to return call to verify she does want brand.

## 2012-07-31 MED ORDER — ATENOLOL 25 MG PO TABS: 25.0000 mg | ORAL_TABLET | Freq: Every day | ORAL | Status: DC

## 2012-07-31 MED ORDER — SYNTHROID 75 MCG PO TABS: 75.0000 ug | ORAL_TABLET | Freq: Every day | ORAL | Status: DC

## 2012-07-31 NOTE — Telephone Encounter (Signed)
No return call from patient.  RX sent same as previous RX's.

## 2012-08-06 ENCOUNTER — Ambulatory Visit (INDEPENDENT_AMBULATORY_CARE_PROVIDER_SITE_OTHER): Payer: BC Managed Care – PPO | Admitting: Family Medicine

## 2012-08-06 ENCOUNTER — Encounter: Payer: Self-pay | Admitting: Family Medicine

## 2012-08-06 VITALS — BP 140/92 | HR 56 | Temp 98.8°F | Ht 63.25 in | Wt 194.0 lb

## 2012-08-06 DIAGNOSIS — Z889 Allergy status to unspecified drugs, medicaments and biological substances status: Secondary | ICD-10-CM

## 2012-08-06 DIAGNOSIS — Z888 Allergy status to other drugs, medicaments and biological substances status: Secondary | ICD-10-CM

## 2012-08-06 DIAGNOSIS — L259 Unspecified contact dermatitis, unspecified cause: Secondary | ICD-10-CM

## 2012-08-06 MED ORDER — MUPIROCIN 2 % EX OINT: TOPICAL_OINTMENT | CUTANEOUS | Status: DC

## 2012-08-06 MED ORDER — FLUTICASONE PROPIONATE 0.05 % EX CREA: TOPICAL_CREAM | Freq: Two times a day (BID) | CUTANEOUS | Status: DC

## 2012-08-06 NOTE — Progress Notes (Signed)
OFFICE NOTE  08/06/2012  CC:  Chief Complaint  Patient presents with  . Rash    ? poison ivy/oak on face and arms; cleaning weeds on Sunday    HPI: Patient is a 62 y.o. Caucasian female who is here for rash.   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  Worsening over the last few days.  Very itchy. Took allegra as she does every day.  Took advil last night.  No benadryl.  Spot on right cheek is weeping. Ice packs being applied.  Areas involved are face, neck, and right wrist (left forearm minimally).  No tongue or throat swelling and no SOB/wheeze.  Lips feel a bit swollen and eyes definitely are swollen.   No fevers, no joint aches.  She has vague recollections of her allergies/intolerances to benadryl and to systemic steroids but essentially says over and over that she will not take either of these types of meds again.  Pertinent PMH:  Past Medical History  Diagnosis Date  . Hyperlipidemia   . GERD (gastroesophageal reflux disease)   . Thyroid disease   . History of skin cancer     non-melanoma  . Hx of migraine headaches   . Bicuspid aortic valve 2011    AVR--bioprosthetic   . Nephrolithiasis   . Fibromyalgia syndrome     Dr. Corliss Skains 12/2011  . IBS (irritable bowel syndrome)    Past Surgical History  Procedure Laterality Date  . Total abdominal hysterectomy w/ bilateral salpingoophorectomy  1984    Nonmalignant reasons  . Cholecystectomy  1984    Lap turned into open  . Breast biopsy  1998    benign  . Tonsillectomy and adenoidectomy  1957  . Cesarean section  1978  . Tissue aortic valve replacement  2011    [place]  . Skin cancer excision      Squamous cell carcinoma x 2  . Hernia repair    . Skin cancer excision      FACE  . Colonoscopy  2003, 2008    Normal  . Esophagogastroduodenoscopy  2003, 2008    gastritis and normal    MEDS:  Outpatient Prescriptions Prior to Visit  Medication Sig Dispense Refill  . ammonium  lactate (AMLACTIN) 12 % cream Apply topically as needed.      Marland Kitchen aspirin 81 MG chewable tablet Chew 81 mg by mouth daily.      Marland Kitchen atenolol (TENORMIN) 25 MG tablet Take 1 tablet (25 mg total) by mouth daily.  90 tablet  1  . atorvastatin (LIPITOR) 20 MG tablet Take 1 tablet (20 mg total) by mouth daily.  90 tablet  1  . clobetasol ointment (TEMOVATE) 0.05 % Apply 1 application topically 2 (two) times daily.      Marland Kitchen escitalopram (LEXAPRO) 20 MG tablet Take 1 tablet (20 mg total) by mouth daily.  90 tablet  3  . fexofenadine (ALLEGRA) 180 MG tablet Take 180 mg by mouth daily.      . Multiple Vitamin (MULTIVITAMIN) tablet Take 1 tablet by mouth daily.      . Omega-3 Fatty Acids (FISH OIL) 1200 MG CAPS Take 1 capsule by mouth 2 (two) times daily.      Marland Kitchen omeprazole (PRILOSEC) 40 MG capsule Take 1 capsule (40 mg total) by mouth daily.  90 capsule  3  . SYNTHROID 75 MCG tablet Take 1 tablet (75 mcg total) by mouth daily.  90 tablet  1  .  traMADol (ULTRAM) 50 MG tablet Take 1-2 tablets (50-100 mg total) by mouth 3 (three) times daily as needed for pain.  180 tablet  0   No facility-administered medications prior to visit.    PE: Blood pressure 140/92, pulse 56, temperature 98.8 F (37.1 C), temperature source Temporal, height 5' 3.25" (1.607 m), weight 194 lb (87.998 kg). Gen: Alert, NAD, smiling and talkative.  Patient is oriented to person, place, time, and situation. Ears: canals and TMs normal.  Nose: no focal lesions and no drainage/edema. Eyes: no bulbar conjunctival injection, no drainage.  EOMI, PERRLA. Face: diffuse deep pinkish, blanching hue with some scattered papulovesicles, some weeping (R cheek > left cheek), with diffuse swelling of both upper and lower eyelids.  Lips not swollen.  Toungue, gingiva, and pharynx without swelling, erythema, or exudate.  Neck with scattered patches of pinkish macular spots and papular spots, no weeping.  Right wrist with pink, barely palpable papules on volar  surface, a few on left forearm as well. Remainder of skin is clear.   CV: RRR, 3/6 systolic murmur unchanged from previous exams LUNGS: CTA bilat, nonlabored resps.  IMPRESSION AND PLAN:  Contact dermatitis Face and neck, primarily. Due to her hx of reaction of some kind to systemic steroids and 1st generation antihistamines, we are limited to topical steroids (which she says she tolerated in the past without problem) and 2nd gen antihistamines. She currently takes allegra 180mg  qd. I added medium-potency topical steroid cream: cutivate 0.05% generic, bid.  We discussed the higher risk for skin thinning and pigmentation changes with this potency steroid on face but we agreed that we'll proceed b/c benefits>risks at this time. I gave bactroban ointment to apply bid to the weeping/moist areas.  Allergy to drug Question of allergic rxn vs intolerance. Also, these episodes were decades ago. Referral to allergist for help with this since these meds obviously can be of use in the future.  Hopefully they can help discern whether or not this is true allergy and possibly solidify this decision about NEVER taking these meds again.  Discussed with patient and she agrees with this referral.   An After Visit Summary was printed and given to the patient.  FOLLOW UP: prn

## 2012-08-06 NOTE — Assessment & Plan Note (Signed)
Question of allergic rxn vs intolerance. Also, these episodes were decades ago. Referral to allergist for help with this since these meds obviously can be of use in the future.  Hopefully they can help discern whether or not this is true allergy and possibly solidify this decision about NEVER taking these meds again.  Discussed with patient and she agrees with this referral.

## 2012-08-06 NOTE — Assessment & Plan Note (Signed)
Face and neck, primarily. Due to her hx of reaction of some kind to systemic steroids and 1st generation antihistamines, we are limited to topical steroids (which she says she tolerated in the past without problem) and 2nd gen antihistamines. She currently takes allegra 180mg  qd. I added medium-potency topical steroid cream: cutivate 0.05% generic, bid.  We discussed the higher risk for skin thinning and pigmentation changes with this potency steroid on face but we agreed that we'll proceed b/c benefits>risks at this time. I gave bactroban ointment to apply bid to the weeping/moist areas.

## 2012-08-08 ENCOUNTER — Telehealth: Payer: Self-pay | Admitting: Family Medicine

## 2012-08-08 NOTE — Telephone Encounter (Signed)
Patient Information:  Caller Name: Farrah  Phone: [phone]  Patient: Madeline Dyer, Madeline Dyer  Gender: Female  DOB: 25-Dec-1950  Age: 61 Years  PCP: Earley Favor [facility])  Office Follow Up:  Does the office need to follow up with this patient?: Yes  Instructions For The Office: Pt is asking does she need an internal antibiotic as she has had open heart surgery in the past.   She wants someone to ask Dr. Milinda Cave right now what can be done.   Triaged and instructed she needs to be seen today, but it is too late to be seen in the office, so directed to go to Urgent Care.  She states it looks exactly the same as it did when Dr. Milinda Cave saw it 08/06/12, but she is calling because she says it has worsened (?).   I explained it needs to be seen as a whole lot could  have changed since Dr. saw it.  I told her I would send a note back but if she had not heard from someone by 1900 she would need to be seen.  She told me don't send a note (but sending for complete documentation)   She states that if it gets worse she will go to an urgent care,  or call office 08/11/12.  She thanked me and hung up.   Symptoms  Reason For Call & Symptoms: She was in for "some kind of poison ivy"  She wants the Dr. asked if this is a sufficient amount of medicaiton with her heart condition?   States she is using the creams presctibed 08/06/12.  She states Dr. Arliss Journey explained what the rash would look like if it became infected and pt states it looks like that now.  She has dark red areas ofskin.  Reviewed Health History In EMR: Yes  Reviewed Medications In EMR: Yes  Reviewed Allergies In EMR: Yes  Reviewed Surgeries / Procedures: Yes  Date of Onset of Symptoms: 08/03/2012  Treatments Tried: Placed on 2 ointments  Treatments Tried Worked: No  Guideline(s) Used:  [employer], [place], and [address]  Poison Ivy - [place] or [facility]  Disposition Per Guideline:   See Today in Office  Reason For Disposition Reached:   Big  blisters or oozing sores  Advice Given:  N/A

## 2012-08-08 NOTE — Telephone Encounter (Signed)
Patient Information:  Caller Name: Haylei  Phone: [phone]  Patient: Madeline Dyer, Madeline Dyer  Gender: Female  DOB: 04-10-1951  Age: 62 Years  PCP: Earley Favor [facility])  Office Follow Up:  Does the office need to follow up with this patient?: Yes  Instructions For The Office: Pt is asking does she need an internal antibiotic as she has had open heart surgery in the past.   She wants someone to ask Dr. Milinda Cave right now what can be done.   Triaged and instructed she needs to be seen today, but it is too late to be seen in the office, so directed to go to Urgent Care.  She states it looks exactly the same as it did when Dr. Milinda Cave saw it 08/06/12, but she is calling because she says it has worsened (?).   I explained it needs to be seen as a whole lot could  have changed since Dr. saw it.   I told her I could send a note back, but if she had not heard from someone by 1900 she would need to go on to be seen.  She stated don't send a note (butI am for documentation purposes)   She states that if it gets worse she will go to an urgent care,  or call office 08/11/12.  She thanked me and hung up.   Symptoms  Reason For Call & Symptoms: She was in for "some kind of poison ivy"  She wants the Dr. asked if this is a sufficient amount of medicaiton with her heart condition?   States she is using the creams presctibed 08/06/12.  She states Dr. Arliss Journey explained what the rash would look like if it became infected and pt states it looks like that now.  She has dark red areas of skin.  Reviewed Health History In EMR: Yes  Reviewed Medications In EMR: Yes  Reviewed Allergies In EMR: Yes  Reviewed Surgeries / Procedures: Yes  Date of Onset of Symptoms: 08/03/2012  Treatments Tried: Placed on 2 ointments  Treatments Tried Worked: No  Guideline(s) Used:  [employer], [place], and [address]  Poison Ivy - [place] or [facility]  Disposition Per Guideline:   See Today in Office  Reason For Disposition Reached:   Big blisters or oozing sores  Advice Given:  N/A

## 2012-08-08 NOTE — Telephone Encounter (Signed)
Please advise 

## 2012-08-09 MED ORDER — CLINDAMYCIN HCL 150 MG PO CAPS: ORAL_CAPSULE | ORAL | Status: DC

## 2012-08-09 NOTE — Telephone Encounter (Signed)
Talked to pt on phone.  She says rash has spread and she thinks the areas on her face are infected. She denies fever, joint swelling, SOB, wheezing, or tongue/throat swelling.  No dizziness, no n/v/d/abd pains. I called in clindamycin 150mg  tid x 10d to her pharmacy.

## 2012-10-10 ENCOUNTER — Telehealth: Payer: Self-pay | Admitting: *Deleted

## 2012-10-10 MED ORDER — ATENOLOL 25 MG PO TABS: 25.0000 mg | ORAL_TABLET | Freq: Every day | ORAL | Status: DC

## 2012-10-10 NOTE — Telephone Encounter (Signed)
Rx request to pharmacy/SLS  

## 2012-10-13 ENCOUNTER — Other Ambulatory Visit: Payer: Self-pay | Admitting: *Deleted

## 2012-10-13 MED ORDER — SYNTHROID 75 MCG PO TABS: 75.0000 ug | ORAL_TABLET | Freq: Every day | ORAL | Status: DC

## 2012-10-13 MED ORDER — ATENOLOL 25 MG PO TABS: 25.0000 mg | ORAL_TABLET | Freq: Every day | ORAL | Status: DC

## 2012-12-01 ENCOUNTER — Other Ambulatory Visit: Payer: Self-pay | Admitting: Family Medicine

## 2012-12-01 MED ORDER — ATORVASTATIN CALCIUM 20 MG PO TABS: 20.0000 mg | ORAL_TABLET | Freq: Every day | ORAL | Status: DC

## 2012-12-01 NOTE — Telephone Encounter (Signed)
I gave pt 1 refill but told her husband to have her schedule an appointment to keep everything up to date before we would give anymore refills.

## 2012-12-15 ENCOUNTER — Telehealth: Payer: Self-pay | Admitting: Internal Medicine

## 2012-12-15 NOTE — Telephone Encounter (Signed)
Patient reports reddish brown substance from her rectum and wants to have a colonoscopy.  She is scheduled to see Willette Cluster RNP tomorrow at 1:30

## 2012-12-16 ENCOUNTER — Encounter: Payer: Self-pay | Admitting: Nurse Practitioner

## 2012-12-16 ENCOUNTER — Encounter: Payer: Self-pay | Admitting: Internal Medicine

## 2012-12-16 ENCOUNTER — Ambulatory Visit (INDEPENDENT_AMBULATORY_CARE_PROVIDER_SITE_OTHER): Payer: BC Managed Care – PPO | Admitting: Nurse Practitioner

## 2012-12-16 VITALS — BP 112/82 | HR 58 | Ht 63.0 in | Wt 195.8 lb

## 2012-12-16 DIAGNOSIS — K625 Hemorrhage of anus and rectum: Secondary | ICD-10-CM | POA: Insufficient documentation

## 2012-12-16 DIAGNOSIS — K589 Irritable bowel syndrome without diarrhea: Secondary | ICD-10-CM

## 2012-12-16 DIAGNOSIS — K648 Other hemorrhoids: Secondary | ICD-10-CM

## 2012-12-16 MED ORDER — MOVIPREP 100 G PO SOLR: 1.0000 | Freq: Once | ORAL | Status: DC

## 2012-12-16 MED ORDER — HYDROCORTISONE ACETATE 25 MG RE SUPP: 25.0000 mg | Freq: Two times a day (BID) | RECTAL | Status: DC

## 2012-12-16 MED ORDER — HYDROCORTISONE ACETATE 25 MG RE SUPP: RECTAL | Status: DC

## 2012-12-16 NOTE — Patient Instructions (Addendum)
We sent prescriptions to [facility] for Anusol [facility] Suppositories also the Moviprep, colonoscopy prep. Call us when you find out about the sedation that was used in [place] for the procedures.   You have been scheduled for a colonoscopy with propofol. Please follow written instructions given to you at your visit today.  Please pick up your prep kit at the pharmacy within the next 1-3 days. If you use inhalers (even only as needed), please bring them with you on the day of your procedure. Your physician has requested that you go to www.startemmi.com and enter the access code given to you at your visit today. This web site gives a general overview about your procedure. However, you should still follow specific instructions given to you by our office regarding your preparation for the procedure.

## 2012-12-16 NOTE — Progress Notes (Signed)
  History of Present Illness:   Patient is a 62 year old female who was seen by Dr. Leone Payor January 2014 for a colon cancer screening. Patient relocated from [place] to [place] a few years ago. Her colonoscopies have been done in [place], patient thought she was due for surveillance examination which was the reason for her January appointment here. After reviewing her records from [place] which turned patient was actually due for recall colonoscopy in 2018.  Patient comes in today to rediscuss surveillance colonoscopy. She apparently received a letter from [facility] stating it was time for her followup colonoscopy. Patient tells me at some point she was deemed high risk for colon cancer and surveillance colonoscopies were recommended every 5 years. Patient had an uncle with colon cancer but no primary relatives with it. She is concerned about abdominal pain and blood in her stool present since prior to being seen here in January. Patient has a history of irritable bowel syndrome with frequent loose stools. Over the last several months she has experienced nausea, lower abdominal pain and loose stools wiith blood. She is concerned about the possibility of colon cancer. No weight loss, she has in fact gained weight. She complains of abdominal bloating.  Current Medications, Allergies, Past Medical History, Past Surgical History, Family History and Social History were reviewed in [employer] record.   Physical Exam: General: Well developed , white female in no acute distress Head: Normocephalic and atraumatic Eyes:  sclerae anicteric, conjunctiva pink  Ears: Normal auditory acuity Lungs: Clear throughout to auscultation Heart: Regular rate and rhythm, murmur present Abdomen: Soft, non distended, mild diffuse tenderness. No masses, no hepatomegaly. Normal bowel sounds Rectal: no external lesions, small internal hemorrhoids on  anoscopy Musculoskeletal: Symmetrical with no gross deformities  Extremities: No edema  Neurological: Alert oriented x 4, grossly nonfocal Psychological:  Alert and cooperative. Normal mood and affect  Assessment and Recommendations:  1.  Several month history of intermittent nausea, lower abdominal pain and loose stools containing blood. Suspect this is IBS related loose stools with hemorrhoidal bleeding but patient will be scheduled for a colonoscopy to exclude other etiologies such as neoplasm or inflammatory bowel disease.  The risks, benefits, and alternatives to colonoscopy with possible biopsy and possible polypectomy were discussed with the patient and she consents to proceed.   2. Small internal hemorrhoids, will treat with steroid suppositories.   3. History of anesthesia related complications. She apparently tolerated her last colonoscopy in  just fine. Not sure what sedation was administered, not listed on colonoscopy report. We have requested anesthesia records. Patient will be scheduled with propofol but that may need to be changed depending on what anesthesia records show.   4. History of bioprosthetic aortic valve replacement 2011. Patient has established care with Dr. Jens Som.

## 2012-12-17 ENCOUNTER — Encounter: Payer: Self-pay | Admitting: Family Medicine

## 2012-12-17 ENCOUNTER — Ambulatory Visit (INDEPENDENT_AMBULATORY_CARE_PROVIDER_SITE_OTHER): Payer: BC Managed Care – PPO | Admitting: Family Medicine

## 2012-12-17 VITALS — BP 133/91 | HR 59 | Temp 97.6°F | Resp 16 | Ht 63.0 in | Wt 197.0 lb

## 2012-12-17 DIAGNOSIS — E785 Hyperlipidemia, unspecified: Secondary | ICD-10-CM

## 2012-12-17 DIAGNOSIS — E039 Hypothyroidism, unspecified: Secondary | ICD-10-CM

## 2012-12-17 DIAGNOSIS — M7662 Achilles tendinitis, left leg: Secondary | ICD-10-CM

## 2012-12-17 DIAGNOSIS — I10 Essential (primary) hypertension: Secondary | ICD-10-CM

## 2012-12-17 DIAGNOSIS — Z1239 Encounter for other screening for malignant neoplasm of breast: Secondary | ICD-10-CM

## 2012-12-17 DIAGNOSIS — M7660 Achilles tendinitis, unspecified leg: Secondary | ICD-10-CM

## 2012-12-17 LAB — COMPREHENSIVE METABOLIC PANEL
ALT: 61 U/L — ABNORMAL HIGH (ref 0–35)
AST: 48 U/L — ABNORMAL HIGH (ref 0–37)
Albumin: 4.2 g/dL (ref 3.5–5.2)
BUN: 15 mg/dL (ref 6–23)
Calcium: 9.9 mg/dL (ref 8.4–10.5)
Chloride: 104 mEq/L (ref 96–112)
Potassium: 4.4 mEq/L (ref 3.5–5.1)
Sodium: 140 mEq/L (ref 135–145)
Total Protein: 6.9 g/dL (ref 6.0–8.3)

## 2012-12-17 NOTE — Progress Notes (Signed)
Agree with Ms. Guenther's assessment and plan. Nehemie Casserly E. Joclynn Lumb, MD, FACG   

## 2012-12-17 NOTE — Progress Notes (Addendum)
OFFICE NOTE  12/17/2012  CC:  Chief Complaint  Patient presents with  . Medication Refill  . blood work    not fasting     HPI: Patient is a 62 y.o. Caucasian female who is here for f/u hyperlipidemia, hypothyroidism, fibromyalgia, GERD---needs med refills.  Compliant with meds, trying to eat a low chol/low fat diet.  Not fasting today. Musculoskeletal pain is the same--not well controlled. Says she is working out for 2 hours at a time, three times a week.  Notes pain and swelling in area of right achilles tendon x 1 mo.  No prior injury recalled. Wore ankle brace and rested ankle (relative rest) for 2 wks--no help.  Pertinent PMH:  Past Medical History  Diagnosis Date  . Hyperlipidemia   . GERD (gastroesophageal reflux disease)   . Thyroid disease   . History of skin cancer     non-melanoma  . Hx of migraine headaches   . Bicuspid aortic valve 2011    AVR--bioprosthetic   . Nephrolithiasis   . Fibromyalgia syndrome     Dr. Corliss Skains 12/2011  . IBS (irritable bowel syndrome)    Past surgical, social, and family history reviewed and no changes noted since last office visit.  MEDS:  Outpatient Prescriptions Prior to Visit  Medication Sig Dispense Refill  . ammonium lactate (AMLACTIN) 12 % cream Apply topically as needed.      Marland Kitchen aspirin 81 MG chewable tablet Chew 81 mg by mouth daily.      Marland Kitchen atenolol (TENORMIN) 25 MG tablet Take 1 tablet (25 mg total) by mouth daily.  90 tablet  0  . atorvastatin (LIPITOR) 20 MG tablet Take 1 tablet (20 mg total) by mouth daily.  90 tablet  1  . escitalopram (LEXAPRO) 20 MG tablet Take 1 tablet (20 mg total) by mouth daily.  90 tablet  3  . fexofenadine (ALLEGRA) 180 MG tablet Take 180 mg by mouth daily.      . hydrocortisone (ANUSOL-HC) 25 MG suppository Use 1 suppository at bedtime for 7 days.  7 suppository  1  . MOVIPREP 100 G SOLR Take 1 kit (100 g total) by mouth once. "Pharmacist please use BIN: [id number] GROUP: [id number] ID:  [id number] Call [phone] for pharmacy questions "Pt will save $10"  1 kit  0  . Multiple Vitamin (MULTIVITAMIN) tablet Take 1 tablet by mouth daily.      . Omega-3 Fatty Acids (FISH OIL) 1200 MG CAPS Take 1 capsule by mouth 2 (two) times daily.      Marland Kitchen omeprazole (PRILOSEC) 40 MG capsule Take 1 capsule (40 mg total) by mouth daily.  90 capsule  3  . SYNTHROID 75 MCG tablet Take 1 tablet (75 mcg total) by mouth daily.  90 tablet  0  . traMADol (ULTRAM) 50 MG tablet Take 1-2 tablets (50-100 mg total) by mouth 3 (three) times daily as needed for pain.  180 tablet  0   No facility-administered medications prior to visit.    PE: Blood pressure 133/91, pulse 59, temperature 97.6 F (36.4 C), temperature source Temporal, resp. rate 16, height 5\' 3"  (1.6 m), weight 197 lb (89.359 kg), SpO2 94.00%. Gen: Alert, well appearing.  Patient is oriented to person, place, time, and situation. CV: RRR LUNGS: CTA bilat, nonlabored resps, good aeration in all lung fields. Right lower leg: mild swelling and tenderness of distal achilles tendon near its insertion in the calcaneus.  No erythema.  Mild warmth.  ROM of ankle  intact--flexion of right foot against resistance causes achilles discomfort.   Strength of LEs 5/5 prox and dist bilat.  IMPRESSION AND PLAN:  1) Right achilles tendonitis.  Relative rest recommended, 1/4" heel lift recommended, alleve 2 tabs per day with food.  If not signif improved in 2 wks pt is to call and we'll ask sports medicine to see her.  2) HTN: stable.  Continue atenolol. Check lytes/cr today.  3) Hyperlipidemia: not fasting today so lipid panel cannot be checked.  Continue statin, monitor hepatic panel today.  4) Hypothyroidism: stable. Check TSH today.  FOLLOW UP:  6 mo f/u htn, hypoth,anxiety

## 2012-12-17 NOTE — Patient Instructions (Addendum)
Buy a 1/4 inch heel lift at any pharmacy or department store and wear it in a comfortable/supportive shoe. Take 2 OTC alleve every morning with breakfast. NO lower extremity workouts for 2 wks. Call in 2 wks if not signif improved.

## 2012-12-18 ENCOUNTER — Telehealth: Payer: Self-pay | Admitting: *Deleted

## 2012-12-18 NOTE — Telephone Encounter (Signed)
Spoke to Dr. Stan Head and he saw my note.  He said we will be good to go with the Propofol sedation for this patient.

## 2012-12-18 NOTE — Telephone Encounter (Signed)
I left a message on the pt's home number, [phone],. I asked if she got the sedation information from [facility] for the procedures she had there. I asked her to please call me.

## 2012-12-18 NOTE — Telephone Encounter (Signed)
I called the [facility] in [place], [place] at [phone] today.  I advised them we got records from them Jan 2014 and scanned them into our system.  I gave them the pt's name and DOB and asked them what sedation was used for the procedures in 2003, and 2008. I was told for both procedures, Propofol was used and she did well in both procedures.

## 2012-12-22 LAB — HEPATITIS C ANTIBODY: HCV Ab: NEGATIVE

## 2012-12-31 ENCOUNTER — Other Ambulatory Visit: Payer: Self-pay | Admitting: Family Medicine

## 2012-12-31 MED ORDER — ATENOLOL 25 MG PO TABS: 25.0000 mg | ORAL_TABLET | Freq: Every day | ORAL | Status: DC

## 2013-01-05 ENCOUNTER — Other Ambulatory Visit: Payer: Self-pay | Admitting: Family Medicine

## 2013-01-05 MED ORDER — ATENOLOL 25 MG PO TABS: 25.0000 mg | ORAL_TABLET | Freq: Every day | ORAL | Status: DC

## 2013-01-06 ENCOUNTER — Ambulatory Visit
Admission: RE | Admit: 2013-01-06 | Discharge: 2013-01-06 | Disposition: A | Discharge status: Home / Self Care | Payer: BC Managed Care – PPO | Source: Ambulatory Visit | Attending: Family Medicine | Admitting: Family Medicine

## 2013-01-06 DIAGNOSIS — Z1239 Encounter for other screening for malignant neoplasm of breast: Secondary | ICD-10-CM

## 2013-01-16 ENCOUNTER — Other Ambulatory Visit: Payer: Self-pay | Admitting: Family Medicine

## 2013-01-16 MED ORDER — ATENOLOL 25 MG PO TABS: 25.0000 mg | ORAL_TABLET | Freq: Every day | ORAL | Status: DC

## 2013-01-22 ENCOUNTER — Encounter: Payer: Self-pay | Admitting: Internal Medicine

## 2013-01-22 ENCOUNTER — Ambulatory Visit (AMBULATORY_SURGERY_CENTER): Payer: BC Managed Care – PPO | Admitting: Internal Medicine

## 2013-01-22 VITALS — BP 124/66 | HR 59 | Temp 98.3°F | Resp 16 | Ht 63.0 in | Wt 197.0 lb

## 2013-01-22 DIAGNOSIS — D126 Benign neoplasm of colon, unspecified: Secondary | ICD-10-CM

## 2013-01-22 DIAGNOSIS — K625 Hemorrhage of anus and rectum: Secondary | ICD-10-CM

## 2013-01-22 MED ORDER — SODIUM CHLORIDE 0.9 % IV SOLN: 500.0000 mL | INTRAVENOUS | Status: DC

## 2013-01-22 NOTE — Progress Notes (Signed)
Patient did not experience any of the following events: a burn prior to discharge; a fall within the facility; wrong site/side/patient/procedure/implant event; or a hospital transfer or hospital admission upon discharge from the facility. (G8907) Patient did not have preoperative order for IV antibiotic SSI prophylaxis. (G8918)  

## 2013-01-22 NOTE — Patient Instructions (Addendum)
I found and removed 3 polyps that were small and look benign.  I saw some skin tags but did not see hemorrhoids though not always seen well with colonoscopy. Gunnar Fusi did see them on anoscopy exam in July.  I will let you know pathology results and when to have another routine colonoscopy by mail.  Please make an appointment to see me and we can review your IBS and the hemorrhoids and how to help with those.  I appreciate the opportunity to care for you. Iva Boop, MD, FACG  YOU HAD AN ENDOSCOPIC PROCEDURE TODAY AT THE [facility]: Refer to the procedure report that was given to you for any specific questions about what was found during the examination.  If the procedure report does not answer your questions, please call your gastroenterologist to clarify.  If you requested that your care partner not be given the details of your procedure findings, then the procedure report has been included in a sealed envelope for you to review at your convenience later.  YOU SHOULD EXPECT: Some feelings of bloating in the abdomen. Passage of more gas than usual.  Walking can help get rid of the air that was put into your GI tract during the procedure and reduce the bloating. If you had a lower endoscopy (such as a colonoscopy or flexible sigmoidoscopy) you may notice spotting of blood in your stool or on the toilet paper. If you underwent a bowel prep for your procedure, then you may not have a normal bowel movement for a few days.  DIET: Your first meal following the procedure should be a light meal and then it is ok to progress to your normal diet.  A half-sandwich or bowl of soup is an example of a good first meal.  Heavy or fried foods are harder to digest and may make you feel nauseous or bloated.  Likewise meals heavy in dairy and vegetables can cause extra gas to form and this can also increase the bloating.  Drink plenty of fluids but you should avoid alcoholic beverages for 24  hours.  ACTIVITY: Your care partner should take you home directly after the procedure.  You should plan to take it easy, moving slowly for the rest of the day.  You can resume normal activity the day after the procedure however you should NOT DRIVE or use heavy machinery for 24 hours (because of the sedation medicines used during the test).    SYMPTOMS TO REPORT IMMEDIATELY: A gastroenterologist can be reached at any hour.  During normal business hours, 8:30 AM to 5:00 PM Monday through Friday, call [phone].  After hours and on weekends, please call the GI answering service at [phone] who will take a message and have the physician on call contact you.   Following lower endoscopy (colonoscopy or flexible sigmoidoscopy):  Excessive amounts of blood in the stool  Significant tenderness or worsening of abdominal pains  Swelling of the abdomen that is new, acute  Fever of 100F or higher  FOLLOW UP: If any biopsies were taken you will be contacted by phone or by letter within the next 1-3 weeks.  Call your gastroenterologist if you have not heard about the biopsies in 3 weeks.  Our staff will call the home number listed on your records the next business day following your procedure to check on you and address any questions or concerns that you may have at that time regarding the information given to you following your  procedure. This is a courtesy call and so if there is no answer at the home number and we have not heard from you through the emergency physician on call, we will assume that you have returned to your regular daily activities without incident.  SIGNATURES/CONFIDENTIALITY: You and/or your care partner have signed paperwork which will be entered into your electronic medical record.  These signatures attest to the fact that that the information above on your After Visit Summary has been reviewed and is understood.  Full responsibility of the confidentiality of this discharge  information lies with you and/or your care-partner.

## 2013-01-22 NOTE — Progress Notes (Signed)
Called to room to assist during endoscopic procedure.  Patient ID and intended procedure confirmed with present staff. Received instructions for my participation in the procedure from the performing physician.  

## 2013-01-22 NOTE — Op Note (Signed)
[facility] [address] [place], [address]   COLONOSCOPY PROCEDURE REPORT  PATIENT: Madeline, Dyer  MR#: [id number] BIRTHDATE: 1950/12/30 , 62  yrs. old GENDER: Female ENDOSCOPIST: Iva Boop, MD, [facility] PROCEDURE DATE:  01/22/2013 PROCEDURE:   Colonoscopy with biopsy and snare polypectomy First Screening Colonoscopy - Avg.  risk and is 50 yrs.  old or older - No.  Prior Negative Screening - Now for repeat screening. Other: See Comments  History of Adenoma - Now for follow-up colonoscopy & has been > or = to 3 yrs.  N/A  Polyps Removed Today? Yes. ASA CLASS:   Class II INDICATIONS:Rectal Bleeding. MEDICATIONS: propofol (Diprivan) 200mg  IV, MAC sedation, administered by CRNA, and These medications were titrated to patient response per physician's verbal order  DESCRIPTION OF PROCEDURE:   After the risks benefits and alternatives of the procedure were thoroughly explained, informed consent was obtained.  A digital rectal exam revealed no rectal mass and A digital rectal exam revealed several skin tags.   The LB [id number]  endoscope was introduced through the anus and advanced to the cecum, which was identified by both the appendix and ileocecal valve. No adverse events experienced.   The quality of the prep was good, using MoviPrep  The instrument was then slowly withdrawn as the colon was fully examined.    COLON FINDINGS: Three diminutive sessile polyps were found at the appendiceal orifice, in the transverse colon, and descending colon. A polypectomy was performed with cold forceps and with a cold snare.  The resection was complete and the polyp tissue was completely retrieved.   The colon mucosa was otherwise normal. Retroflexed views revealed no abnormalities. The time to cecum=1 minutes 46 seconds.  Withdrawal time=11 minutes 54 seconds.  The scope was withdrawn and the procedure completed. COMPLICATIONS: There were no  complications.  ENDOSCOPIC IMPRESSION: 1.   Three diminutive sessile polyps were found at the appendiceal orifice, in the transverse colon, and descending colon; polypectomy was performed with cold forceps and with a cold snare 2.   The colon mucosa was otherwise normal - good prep  RECOMMENDATIONS: 1.  Timing of repeat colonoscopy will be determined by pathology findings. 2.   call office and arrange appointment re: IBS and hemorrhoids   eSigned:  Iva Boop, MD, [facility] 01/22/2013 4:14 PM cc: Earley Favor, MD and The Patient

## 2013-01-22 NOTE — Progress Notes (Signed)
A/ox3 pleased with MAC, report to Evangeline Gula

## 2013-01-23 ENCOUNTER — Telehealth: Payer: Self-pay

## 2013-01-23 NOTE — Telephone Encounter (Signed)
Unable to leave message no voice mail set up. 

## 2013-01-28 ENCOUNTER — Encounter: Payer: Self-pay | Admitting: Internal Medicine

## 2013-01-28 DIAGNOSIS — Z860100 Personal history of colon polyps, unspecified: Secondary | ICD-10-CM | POA: Insufficient documentation

## 2013-01-28 HISTORY — DX: Personal history of colonic polyps: Z86.010

## 2013-01-28 HISTORY — DX: Personal history of colon polyps, unspecified: Z86.0100

## 2013-01-28 NOTE — Progress Notes (Signed)
Quick Note:  One diminutive adenoma Repeat colonoscopy 2019 ______

## 2013-03-24 ENCOUNTER — Other Ambulatory Visit: Payer: Self-pay | Admitting: Family Medicine

## 2013-03-24 MED ORDER — SYNTHROID 75 MCG PO TABS: 75.0000 ug | ORAL_TABLET | Freq: Every day | ORAL | Status: DC

## 2013-03-31 ENCOUNTER — Other Ambulatory Visit: Payer: Self-pay | Admitting: Family Medicine

## 2013-03-31 MED ORDER — OMEPRAZOLE 40 MG PO CPDR: 40.0000 mg | DELAYED_RELEASE_CAPSULE | Freq: Every day | ORAL | Status: DC

## 2013-04-16 ENCOUNTER — Other Ambulatory Visit: Payer: Self-pay

## 2013-05-12 ENCOUNTER — Other Ambulatory Visit: Payer: Self-pay | Admitting: Family Medicine

## 2013-05-12 MED ORDER — ATORVASTATIN CALCIUM 20 MG PO TABS: 20.0000 mg | ORAL_TABLET | Freq: Every day | ORAL | Status: DC

## 2013-05-12 MED ORDER — ESCITALOPRAM OXALATE 20 MG PO TABS: 20.0000 mg | ORAL_TABLET | Freq: Every day | ORAL | Status: DC

## 2013-06-11 DIAGNOSIS — K760 Fatty (change of) liver, not elsewhere classified: Secondary | ICD-10-CM

## 2013-06-11 HISTORY — DX: Fatty (change of) liver, not elsewhere classified: K76.0

## 2013-07-14 ENCOUNTER — Telehealth: Payer: Self-pay | Admitting: Family Medicine

## 2013-07-14 DIAGNOSIS — M791 Myalgia, unspecified site: Secondary | ICD-10-CM

## 2013-07-14 MED ORDER — TRAMADOL HCL 50 MG PO TABS: 50.0000 mg | ORAL_TABLET | Freq: Three times a day (TID) | ORAL | Status: DC | PRN

## 2013-07-14 MED ORDER — ATENOLOL 25 MG PO TABS: 25.0000 mg | ORAL_TABLET | Freq: Every day | ORAL | Status: DC

## 2013-07-14 NOTE — Telephone Encounter (Signed)
Tramadol rx printed. 

## 2013-07-14 NOTE — Telephone Encounter (Signed)
Patient requesting refill on tramadol.  Patient last seen on 12/17/12.  Last rx was printed on 06/23/12.  Please advise refill.

## 2013-07-15 NOTE — Telephone Encounter (Signed)
Rx faxed

## 2013-08-03 ENCOUNTER — Other Ambulatory Visit: Payer: Self-pay | Admitting: Family Medicine

## 2013-08-14 ENCOUNTER — Other Ambulatory Visit: Payer: Self-pay | Admitting: Family Medicine

## 2013-08-14 NOTE — Telephone Encounter (Signed)
Received refill request for medications but patient is over due for 6 month f/u.  LMOM for pt to call back.

## 2013-08-17 ENCOUNTER — Encounter: Payer: Self-pay | Admitting: Family Medicine

## 2013-08-17 ENCOUNTER — Ambulatory Visit (INDEPENDENT_AMBULATORY_CARE_PROVIDER_SITE_OTHER): Payer: BC Managed Care – PPO | Admitting: Family Medicine

## 2013-08-17 VITALS — BP 134/89 | HR 63 | Temp 98.4°F | Resp 18 | Wt 205.0 lb

## 2013-08-17 DIAGNOSIS — E785 Hyperlipidemia, unspecified: Secondary | ICD-10-CM

## 2013-08-17 DIAGNOSIS — IMO0001 Reserved for inherently not codable concepts without codable children: Secondary | ICD-10-CM

## 2013-08-17 DIAGNOSIS — M791 Myalgia, unspecified site: Secondary | ICD-10-CM

## 2013-08-17 DIAGNOSIS — H8190 Unspecified disorder of vestibular function, unspecified ear: Secondary | ICD-10-CM

## 2013-08-17 DIAGNOSIS — H8120 Vestibular neuronitis, unspecified ear: Secondary | ICD-10-CM

## 2013-08-17 DIAGNOSIS — I10 Essential (primary) hypertension: Secondary | ICD-10-CM

## 2013-08-17 DIAGNOSIS — R42 Dizziness and giddiness: Secondary | ICD-10-CM

## 2013-08-17 DIAGNOSIS — E039 Hypothyroidism, unspecified: Secondary | ICD-10-CM

## 2013-08-17 MED ORDER — ESCITALOPRAM OXALATE 20 MG PO TABS: 20.0000 mg | ORAL_TABLET | Freq: Every day | ORAL | Status: DC

## 2013-08-17 MED ORDER — ATORVASTATIN CALCIUM 20 MG PO TABS: 20.0000 mg | ORAL_TABLET | Freq: Every day | ORAL | Status: DC

## 2013-08-17 MED ORDER — OMEPRAZOLE 40 MG PO CPDR: 40.0000 mg | DELAYED_RELEASE_CAPSULE | Freq: Every day | ORAL | Status: DC

## 2013-08-17 MED ORDER — ATENOLOL 25 MG PO TABS: 25.0000 mg | ORAL_TABLET | Freq: Every day | ORAL | Status: DC

## 2013-08-17 NOTE — Progress Notes (Signed)
OFFICE NOTE  08/17/2013  CC:  Chief Complaint  Patient presents with  . Medication Refill    omeprazole     HPI: Patient is a 63 y.o. Caucasian female who is here for medication recheck for hypothyroidism, fibromyalgia syndrome, hyperlipidemia.   She describes sporadic dizziness/vertigo, nauseated, lasts less than a minute.  No clear trigger.  She is questioning whether one of her meds is causing it, but admits the sx's have random timing and is not clearly related with taking/starting one of her meds.  She exercises 3-5 times per week most weeks, has trouble dieting.  Admits to "too much wine" at night as her weakness.  No new physical complaints. She is still looking for a social life since moving to [facility]. Says "I drink black coffee all morning, water all afternoon, 3-4 glasses of wine every evening".  Pertinent PMH:  Past medical, surgical, social, and family history reviewed and no changes are noted since last office visit.  MEDS:  Outpatient Prescriptions Prior to Visit  Medication Sig Dispense Refill  . ammonium lactate (AMLACTIN) 12 % cream Apply topically as needed.      Marland Kitchen aspirin 81 MG chewable tablet Chew 81 mg by mouth daily.      . fexofenadine (ALLEGRA) 180 MG tablet Take 180 mg by mouth daily.      . hydrocortisone (ANUSOL-HC) 25 MG suppository Use 1 suppository at bedtime for 7 days.  7 suppository  1  . Multiple Vitamin (MULTIVITAMIN) tablet Take 1 tablet by mouth daily.      . Omega-3 Fatty Acids (FISH OIL) 1200 MG CAPS Take 1 capsule by mouth 2 (two) times daily.      Marland Kitchen SYNTHROID 75 MCG tablet TAKE 1 BY MOUTH DAILY  90 tablet  1  . traMADol (ULTRAM) 50 MG tablet Take 1-2 tablets (50-100 mg total) by mouth 3 (three) times daily as needed.  180 tablet  1  . atenolol (TENORMIN) 25 MG tablet Take 1 tablet (25 mg total) by mouth daily.  90 tablet  0  . atorvastatin (LIPITOR) 20 MG tablet Take 1 tablet (20 mg total) by mouth daily.  90 tablet  1  . escitalopram  (LEXAPRO) 20 MG tablet Take 1 tablet (20 mg total) by mouth daily.  90 tablet  1  . omeprazole (PRILOSEC) 40 MG capsule Take 1 capsule (40 mg total) by mouth daily.  90 capsule  1   No facility-administered medications prior to visit.    PE: Blood pressure 134/89, pulse 63, temperature 98.4 F (36.9 C), temperature source Oral, resp. rate 18, weight 205 lb (92.987 kg), SpO2 93.00%. Gen: Alert, well appearing.  Patient is oriented to person, place, time, and situation. Left heel: small, firm swelling at the site of the achilles tendon insertion onto calcaneus. Mild TTP around lateral aspect of this.  No erythema, no warmth. Left ankle and foot ROM fully intact and without pain or weakness.  Dix Halpike maneuver: negative bilat.   IMPRESSION AND PLAN:  1) Chronic probs; fibromyalgia, hypothyroidism, hyperlipidemia, depression/anxiety, HTN--all stable. Will RF meds x 90d with 1 additional RF. Will do routine labs at next f/u in 6 mo.  2) Distal left achilles tendon swelling: no further w/u indicated or desired by pt at this time.  Reassured pt.  3) BPPV by pt's description.  Doubt her sx's are related to any of her medications. Reassured pt.  FOLLOW UP: 61mo for CPE, fasting labs the week prior

## 2013-08-17 NOTE — Progress Notes (Signed)
Pre visit review using our clinic review tool, if applicable. No additional management support is needed unless otherwise documented below in the visit note. 

## 2013-10-28 ENCOUNTER — Encounter: Payer: Self-pay | Admitting: Physician Assistant

## 2013-10-28 ENCOUNTER — Ambulatory Visit (INDEPENDENT_AMBULATORY_CARE_PROVIDER_SITE_OTHER): Payer: BC Managed Care – PPO | Admitting: Physician Assistant

## 2013-10-28 VITALS — BP 118/82 | HR 66 | Temp 97.9°F | Resp 16 | Ht 63.0 in | Wt 200.0 lb

## 2013-10-28 DIAGNOSIS — T148 Other injury of unspecified body region: Secondary | ICD-10-CM

## 2013-10-28 DIAGNOSIS — W57XXXA Bitten or stung by nonvenomous insect and other nonvenomous arthropods, initial encounter: Secondary | ICD-10-CM

## 2013-10-28 NOTE — Progress Notes (Signed)
Patient presents to clinic today c/o pain and swelling of left ear and left-side of face after being stung/bit by an insect while mowing the yard.  Patient denies dyspnea or increased work of breathing. Denies swelling or pain elsewhere.  Patient is unsure of what may have bit/stung her.  Patient is allergic to systemic steroids and antihistamines.  Past Medical History  Diagnosis Date  . Hyperlipidemia   . GERD (gastroesophageal reflux disease)   . Thyroid disease   . History of skin cancer     non-melanoma  . Hx of migraine headaches   . Bicuspid aortic valve 2011    AVR--bioprosthetic   . Nephrolithiasis   . Fibromyalgia syndrome     Dr. Estanislado Pandy 12/2011  . IBS (irritable bowel syndrome)   . Personal history of colonic adenoma 01/28/2013    Current Outpatient Prescriptions on File Prior to Visit  Medication Sig Dispense Refill  . ammonium lactate (AMLACTIN) 12 % cream Apply topically as needed.      Marland Kitchen aspirin 81 MG chewable tablet Chew 81 mg by mouth daily.      Marland Kitchen atenolol (TENORMIN) 25 MG tablet Take 1 tablet (25 mg total) by mouth daily.  90 tablet  1  . atorvastatin (LIPITOR) 20 MG tablet Take 1 tablet (20 mg total) by mouth daily.  90 tablet  1  . escitalopram (LEXAPRO) 20 MG tablet Take 1 tablet (20 mg total) by mouth daily.  90 tablet  1  . fexofenadine (ALLEGRA) 180 MG tablet Take 180 mg by mouth daily.      . Multiple Vitamin (MULTIVITAMIN) tablet Take 1 tablet by mouth daily.      . Omega-3 Fatty Acids (FISH OIL) 1200 MG CAPS Take 1 capsule by mouth 2 (two) times daily.      Marland Kitchen omeprazole (PRILOSEC) 40 MG capsule Take 1 capsule (40 mg total) by mouth daily.  90 capsule  1  . SYNTHROID 75 MCG tablet TAKE 1 BY MOUTH DAILY  90 tablet  1  . traMADol (ULTRAM) 50 MG tablet Take 1-2 tablets (50-100 mg total) by mouth 3 (three) times daily as needed.  180 tablet  1   No current facility-administered medications on file prior to visit.    Allergies  Allergen Reactions  .  Antihistamines, Diphenhydramine-Type Other (See Comments)    Pt can't recall  . Latex   . Penicillins     Brain swelling  . Prednisone Other (See Comments)    Unclear rxn to systemic steroids in the past and she will not take them again.    Family History  Problem Relation Age of Onset  . Heart disease Mother     MI  . Hyperlipidemia Mother   . Hypertension Mother   . Heart disease Father     Aortic aneurysm  . Hyperlipidemia Father   . Hypertension Father   . Breast cancer Maternal Grandmother    History   Social History  . Marital Status: Married    Spouse Name: N/A    Number of Children: 1  . Years of Education: N/A   Occupational History  . Homemaker    Social History Main Topics  . Smoking status: Former Smoker -- 0.50 packs/day    Types: Cigarettes    Quit date: 06/11/1998  . Smokeless tobacco: Never Used     Comment: smoked off and on since 1970  . Alcohol Use: 6.0 oz/week    10 Glasses of wine per week  Comment: 2 glasses of wine nightly  . Drug Use: No  . Sexual Activity: Yes   Other Topics Concern  . None   Social History Narrative   Married, retired.   Relocated to [place] from [place] 2012.   Tobacco 15 pack-yr history, quit 2000.   Drinks 2 glasses of wine per night.  No hx of drug use.   No exercise.            Review of Systems - See HPI.  All other ROS are negative.  BP 118/82  Pulse 66  Temp(Src) 97.9 F (36.6 C) (Oral)  Resp 16  Ht 5\' 3"  (1.6 m)  Wt 200 lb (90.719 kg)  BMI 35.44 kg/m2  SpO2 95%  Physical Exam  Vitals reviewed. Constitutional: She is oriented to person, place, and time and well-developed, well-nourished, and in no distress.  HENT:  Head: Normocephalic and atraumatic.  Right Ear: Tympanic membrane, external ear and ear canal normal.  Left Ear: Tympanic membrane and ear canal normal.  Nose: Nose normal.  Mouth/Throat: Uvula is midline, oropharynx is clear and moist and mucous membranes are normal. No  oropharyngeal exudate.  L auricle with swelling and wamrth, extending into preauricular area.  No puncture wound noted.  No evidence of excoriation.  Eyes: Conjunctivae are normal. Pupils are equal, round, and reactive to light.  Neck: Neck supple.  Cardiovascular: Normal rate, regular rhythm and intact distal pulses.   III/VI murmur auscultated.  This is not a new murmur.  Neurological: She is alert and oriented to person, place, and time.  Psychiatric: Affect normal.   Assessment/Plan: Insect bite With mild swelling and tenderness.  Likely localized reaction.  No sign/symptoms of respiratory distress or anaphylaxis.  Patient allergic to steroids and antihistamines.  Encourage cool compresses, Sarna lotion for itch, OTC pain medications.  Discussed alarm signs/symptoms with patient.  Patient to call or return to clinic if symptoms not improving within 24-48 hours or if symptoms acutely worsen.

## 2013-10-28 NOTE — Progress Notes (Signed)
Pre visit review using our clinic review tool, if applicable. No additional management support is needed unless otherwise documented below in the visit note/SLS  

## 2013-10-28 NOTE — Assessment & Plan Note (Signed)
With mild swelling and tenderness.  Likely localized reaction.  No sign/symptoms of respiratory distress or anaphylaxis.  Patient allergic to steroids and antihistamines.  Encourage cool compresses, Sarna lotion for itch, OTC pain medications.  Discussed alarm signs/symptoms with patient.  Patient to call or return to clinic if symptoms not improving within 24-48 hours or if symptoms acutely worsen.

## 2013-10-28 NOTE — Patient Instructions (Signed)
Your symptoms are a result of a localized reaction to whatever bit or stung you.  Unfortunately you are allergic to antihistamines and steroids.  I do feel your symptoms will improve on their own.  In the mean time, apply ice to face to help reduce swelling.  Apply Sarna lotion for itch.  Over-the-counter pain medication for pain relief.  Monitor symptoms.  If no symptoms improvement within 24-48 hours, or if symptoms worsen, please call or return to clinic.

## 2014-01-02 ENCOUNTER — Other Ambulatory Visit: Payer: Self-pay | Admitting: Family Medicine

## 2014-01-27 ENCOUNTER — Other Ambulatory Visit: Payer: Self-pay | Admitting: Family Medicine

## 2014-02-08 ENCOUNTER — Other Ambulatory Visit: Payer: Self-pay

## 2014-02-08 DIAGNOSIS — Z1231 Encounter for screening mammogram for malignant neoplasm of breast: Secondary | ICD-10-CM

## 2014-02-11 ENCOUNTER — Ambulatory Visit
Admission: RE | Admit: 2014-02-11 | Discharge: 2014-02-11 | Disposition: A | Discharge status: Home / Self Care | Payer: BC Managed Care – PPO | Source: Ambulatory Visit

## 2014-02-11 DIAGNOSIS — Z1231 Encounter for screening mammogram for malignant neoplasm of breast: Secondary | ICD-10-CM

## 2014-02-17 ENCOUNTER — Encounter: Payer: BC Managed Care – PPO | Admitting: Family Medicine

## 2014-04-21 ENCOUNTER — Ambulatory Visit (INDEPENDENT_AMBULATORY_CARE_PROVIDER_SITE_OTHER): Payer: BC Managed Care – PPO | Admitting: Family Medicine

## 2014-04-21 ENCOUNTER — Other Ambulatory Visit: Payer: Self-pay | Admitting: Family Medicine

## 2014-04-21 ENCOUNTER — Encounter: Payer: Self-pay | Admitting: Family Medicine

## 2014-04-21 VITALS — BP 151/98 | HR 82 | Temp 97.3°F | Ht 63.0 in | Wt 204.0 lb

## 2014-04-21 DIAGNOSIS — L821 Other seborrheic keratosis: Secondary | ICD-10-CM

## 2014-04-21 DIAGNOSIS — Z23 Encounter for immunization: Secondary | ICD-10-CM

## 2014-04-21 DIAGNOSIS — Z0000 Encounter for general adult medical examination without abnormal findings: Secondary | ICD-10-CM

## 2014-04-21 MED ORDER — ZOSTER VACCINE LIVE 19400 UNT/0.65ML ~~LOC~~ SOLR: 0.6500 mL | Freq: Once | SUBCUTANEOUS | Status: DC

## 2014-04-21 NOTE — Patient Instructions (Signed)
Seborrheic keratosis

## 2014-04-21 NOTE — Progress Notes (Signed)
OFFICE NOTE  04/21/2014  CC: Skin lesion HPI: Patient is a 63 y.o. Caucasian female who is here for brownish, large, plaque-like lesion that has been on left lower abd region for a couple of months.  She has tried to pick it off and it did come nearly completely off once but quickly returned.   Pertinent PMH:  Past medical, surgical, social, and family history reviewed and no changes are noted since last office visit.  MEDS:  Outpatient Prescriptions Prior to Visit  Medication Sig Dispense Refill  . ammonium lactate (AMLACTIN) 12 % cream Apply topically as needed.    Marland Kitchen aspirin 81 MG chewable tablet Chew 81 mg by mouth daily.    Marland Kitchen atenolol (TENORMIN) 25 MG tablet TAKE 1 BY MOUTH DAILY 90 tablet 0  . atorvastatin (LIPITOR) 20 MG tablet TAKE 1 BY MOUTH DAILY 90 tablet 0  . Cholecalciferol (VITAMIN D-3) 1000 UNITS CAPS Take 1,000 Units by mouth daily.    Marland Kitchen escitalopram (LEXAPRO) 20 MG tablet TAKE 1 BY MOUTH DAILY 90 tablet 0  . fexofenadine (ALLEGRA) 180 MG tablet Take 180 mg by mouth daily.    . Multiple Vitamin (MULTIVITAMIN) tablet Take 1 tablet by mouth daily.    . Omega-3 Fatty Acids (FISH OIL) 1200 MG CAPS Take 1 capsule by mouth 2 (two) times daily.    Marland Kitchen omeprazole (PRILOSEC) 40 MG capsule TAKE 1 BY MOUTH DAILY 90 capsule 0  . SYNTHROID 75 MCG tablet TAKE 1 BY MOUTH DAILY 90 tablet 0  . traMADol (ULTRAM) 50 MG tablet Take 1-2 tablets (50-100 mg total) by mouth 3 (three) times daily as needed. 180 tablet 1   No facility-administered medications prior to visit.    PE: Blood pressure 151/98, pulse 82, temperature 97.3 F (36.3 C), temperature source Temporal, height 5\' 3"  (1.6 m), weight 204 lb (92.534 kg), SpO2 95 %. Gen: Alert, well appearing.  Patient is oriented to person, place, time, and situation. SKIN: left lower quad of abd with approx 1.5-2 cm oval, greasy texture/flaky surface "stuck-on" lesion that has a bumpy surface.  No erythema or tenderness.  IMPRESSION AND  PLAN: Seborrheic keratosis, non-inflamed. Reassured pt of benign nature of lesion.  Recommended she simply leave it alone.  Flu vaccine IM today. Printed rx for zostavax for pt to take to her pharmacy.  FOLLOW UP: she'll be making appt for CPE soon and will get fating labs the week prior.

## 2014-04-23 ENCOUNTER — Other Ambulatory Visit (INDEPENDENT_AMBULATORY_CARE_PROVIDER_SITE_OTHER): Payer: BC Managed Care – PPO

## 2014-04-23 DIAGNOSIS — Z0000 Encounter for general adult medical examination without abnormal findings: Secondary | ICD-10-CM

## 2014-04-23 LAB — CBC WITH DIFFERENTIAL/PLATELET
BASOS ABS: 0 10*3/uL (ref 0.0–0.1)
BASOS PCT: 0.4 % (ref 0.0–3.0)
EOS ABS: 0.5 10*3/uL (ref 0.0–0.7)
Eosinophils Relative: 5.2 % — ABNORMAL HIGH (ref 0.0–5.0)
HCT: 42.3 % (ref 36.0–46.0)
HEMOGLOBIN: 13.8 g/dL (ref 12.0–15.0)
Lymphocytes Relative: 21.1 % (ref 12.0–46.0)
Lymphs Abs: 2.1 10*3/uL (ref 0.7–4.0)
MCHC: 32.6 g/dL (ref 30.0–36.0)
MCV: 91.8 fl (ref 78.0–100.0)
MONO ABS: 0.7 10*3/uL (ref 0.1–1.0)
Monocytes Relative: 6.9 % (ref 3.0–12.0)
NEUTROS ABS: 6.8 10*3/uL (ref 1.4–7.7)
NEUTROS PCT: 66.4 % (ref 43.0–77.0)
Platelets: 218 10*3/uL (ref 150.0–400.0)
RBC: 4.6 Mil/uL (ref 3.87–5.11)
RDW: 14 % (ref 11.5–15.5)
WBC: 10.2 10*3/uL (ref 4.0–10.5)

## 2014-04-23 LAB — LIPID PANEL
CHOL/HDL RATIO: 5
Cholesterol: 194 mg/dL (ref 0–200)
HDL: 43.1 mg/dL (ref >39.00)
LDL CALC: 121 mg/dL — AB (ref 0–99)
NONHDL: 150.9
TRIGLYCERIDES: 149 mg/dL (ref 0.0–149.0)
VLDL: 29.8 mg/dL (ref 0.0–40.0)

## 2014-04-23 LAB — COMPREHENSIVE METABOLIC PANEL
ALBUMIN: 3.5 g/dL (ref 3.5–5.2)
ALK PHOS: 173 U/L — AB (ref 39–117)
ALT: 99 U/L — AB (ref 0–35)
AST: 86 U/L — AB (ref 0–37)
BUN: 13 mg/dL (ref 6–23)
CALCIUM: 9.8 mg/dL (ref 8.4–10.5)
CO2: 25 mEq/L (ref 19–32)
Chloride: 106 mEq/L (ref 96–112)
Creatinine, Ser: 0.8 mg/dL (ref 0.4–1.2)
GFR: 82.8 mL/min (ref >60.00)
Glucose, Bld: 108 mg/dL — ABNORMAL HIGH (ref 70–99)
Potassium: 4.2 mEq/L (ref 3.5–5.1)
SODIUM: 138 meq/L (ref 135–145)
TOTAL PROTEIN: 6.6 g/dL (ref 6.0–8.3)
Total Bilirubin: 0.5 mg/dL (ref 0.2–1.2)

## 2014-04-23 LAB — TSH: TSH: 7 u[IU]/mL — ABNORMAL HIGH (ref 0.35–4.50)

## 2014-04-29 ENCOUNTER — Other Ambulatory Visit: Payer: Self-pay | Admitting: Family Medicine

## 2014-04-29 ENCOUNTER — Telehealth: Payer: Self-pay

## 2014-04-29 NOTE — Telephone Encounter (Signed)
Yes, I reviewed them.  I thought she was coming in a week after her labs as usual. She can come in for 30 min CPE at her earliest convenience.  A couple of her labs were mildly abnormal but we can discuss them at her o/v.-thx

## 2014-04-29 NOTE — Telephone Encounter (Signed)
Left message for pt to call back  °

## 2014-04-29 NOTE — Telephone Encounter (Signed)
Pt called requesting lab results and wanted to know when to schedule appt. It looks like the labs are in but I am not sure if you have reviewed them. Please advise.

## 2014-04-29 NOTE — Telephone Encounter (Signed)
Spoke with pt, advised message from Dr Anitra Lauth. Pt understood and made appt for CPE.

## 2014-05-12 ENCOUNTER — Encounter: Payer: Self-pay | Admitting: Family Medicine

## 2014-05-12 ENCOUNTER — Ambulatory Visit (INDEPENDENT_AMBULATORY_CARE_PROVIDER_SITE_OTHER): Payer: BC Managed Care – PPO | Admitting: Family Medicine

## 2014-05-12 VITALS — BP 152/110 | HR 70 | Temp 98.6°F | Resp 18 | Ht 63.0 in | Wt 207.0 lb

## 2014-05-12 DIAGNOSIS — E785 Hyperlipidemia, unspecified: Secondary | ICD-10-CM

## 2014-05-12 DIAGNOSIS — R740 Nonspecific elevation of levels of transaminase and lactic acid dehydrogenase [LDH]: Secondary | ICD-10-CM

## 2014-05-12 DIAGNOSIS — R748 Abnormal levels of other serum enzymes: Secondary | ICD-10-CM

## 2014-05-12 DIAGNOSIS — E039 Hypothyroidism, unspecified: Secondary | ICD-10-CM

## 2014-05-12 DIAGNOSIS — R7401 Elevation of levels of liver transaminase levels: Secondary | ICD-10-CM

## 2014-05-12 DIAGNOSIS — Z0000 Encounter for general adult medical examination without abnormal findings: Secondary | ICD-10-CM

## 2014-05-12 MED ORDER — ATENOLOL 25 MG PO TABS: ORAL_TABLET | ORAL | Status: AC

## 2014-05-12 MED ORDER — LEVOTHYROXINE SODIUM 100 MCG PO TABS: 100.0000 ug | ORAL_TABLET | Freq: Every day | ORAL | Status: DC

## 2014-05-12 MED ORDER — ATORVASTATIN CALCIUM 20 MG PO TABS: ORAL_TABLET | ORAL | Status: AC

## 2014-05-12 MED ORDER — ESCITALOPRAM OXALATE 20 MG PO TABS: ORAL_TABLET | ORAL | Status: AC

## 2014-05-12 NOTE — Assessment & Plan Note (Signed)
Plus elevated alkaline phosphatase level. Hx of cholecystectomy. No GI symptomatology. +Hx of hyperlipidemia and currently on statin.  Viral hep screening neg in 2014. Will check abd u/s to further eval for hepatic steatosis.

## 2014-05-12 NOTE — Assessment & Plan Note (Signed)
Reviewed age and gender appropriate health maintenance issues (prudent diet, regular exercise, health risks of tobacco and excessive alcohol, use of seatbelts, fire alarms in home, use of sunscreen).  Also reviewed age and gender appropriate health screening as well as vaccine recommendations. No vaccines due. Colon cancer screening UTD. Not a cervical ca screening candidate due to hx of hysterectomy for non-malignant reasons.  Mammogram UTD. HP labs UTD/reviewed.

## 2014-05-12 NOTE — Progress Notes (Signed)
Office Note 05/12/2014  CC:  Chief Complaint  Patient presents with  . Annual Exam    HPI:  Madeline Dyer is a 63 y.o. White female who is CPE. We reviewed her recent fasting labs today in detail (see below). She says her TSH being up makes sense b/c she is always feeling very fatigued, hair falling out a lot in frontal region.  Says she accidentally took a second thyroid pill one day and that is the day she absolutely felt fabulous.  Her exercise habits had declined for quite a while but now have picked up and we decided to continue current statin dose at this time.  Past Medical History  Diagnosis Date  . Hyperlipidemia   . GERD (gastroesophageal reflux disease)   . Thyroid disease   . History of skin cancer     non-melanoma  . Hx of migraine headaches   . Bicuspid aortic valve 2011    AVR--bioprosthetic   . Nephrolithiasis   . Fibromyalgia syndrome     Dr. Estanislado Pandy 12/2011  . IBS (irritable bowel syndrome)   . Personal history of colonic adenoma 01/28/2013    Past Surgical History  Procedure Laterality Date  . Total abdominal hysterectomy w/ bilateral salpingoophorectomy  1984    Nonmalignant reasons  . Cholecystectomy  1984    Lap turned into open  . Breast biopsy  1998    benign  . Tonsillectomy and adenoidectomy  1957  . Cesarean section  1978  . Tissue aortic valve replacement  2011    [place]  . Skin cancer excision      Squamous cell carcinoma x 2  . Hernia repair    . Skin cancer excision      FACE  . Colonoscopy  2003, 2008; 2014    2014 one diminutive adenoma; repeat 2019.  Marland Kitchen Esophagogastroduodenoscopy  2003, 2008    gastritis and normal    Family History  Problem Relation Age of Onset  . Heart disease Mother     MI  . Hyperlipidemia Mother   . Hypertension Mother   . Heart disease Father     Aortic aneurysm  . Hyperlipidemia Father   . Hypertension Father   . Breast cancer Maternal Grandmother   . Thyroid cancer Mother      History   Social History  . Marital Status: Married    Spouse Name: N/A    Number of Children: 1  . Years of Education: N/A   Occupational History  . Homemaker    Social History Main Topics  . Smoking status: Former Smoker -- 0.50 packs/day    Types: Cigarettes    Quit date: 06/11/1998  . Smokeless tobacco: Never Used     Comment: smoked off and on since 1970  . Alcohol Use: 6.0 oz/week    10 Glasses of wine per week     Comment: 2 glasses of wine nightly  . Drug Use: No  . Sexual Activity: Yes   Other Topics Concern  . Not on file   Social History Narrative   Married, retired.   Relocated to [place] from [place] 2012.   Tobacco 15 pack-yr history, quit 2000.   Drinks 2 glasses of wine per night.  No hx of drug use.   No exercise.             Outpatient Prescriptions Prior to Visit  Medication Sig Dispense Refill  . ammonium lactate (AMLACTIN) 12 % cream Apply topically as needed.    Marland Kitchen  aspirin 81 MG chewable tablet Chew 81 mg by mouth daily.    . Cholecalciferol (VITAMIN D-3) 1000 UNITS CAPS Take 1,000 Units by mouth daily.    . fexofenadine (ALLEGRA) 180 MG tablet Take 180 mg by mouth daily.    . Multiple Vitamin (MULTIVITAMIN) tablet Take 1 tablet by mouth daily.    . Omega-3 Fatty Acids (FISH OIL) 1200 MG CAPS Take 1 capsule by mouth 2 (two) times daily.    Marland Kitchen omeprazole (PRILOSEC) 40 MG capsule TAKE 1 BY MOUTH DAILY 90 capsule 0  . traMADol (ULTRAM) 50 MG tablet Take 1-2 tablets (50-100 mg total) by mouth 3 (three) times daily as needed. 180 tablet 1  . atenolol (TENORMIN) 25 MG tablet TAKE 1 BY MOUTH DAILY 90 tablet 0  . atorvastatin (LIPITOR) 20 MG tablet TAKE 1 BY MOUTH DAILY 90 tablet 0  . escitalopram (LEXAPRO) 20 MG tablet TAKE 1 BY MOUTH DAILY 90 tablet 0  . SYNTHROID 75 MCG tablet TAKE 1 BY MOUTH DAILY 90 tablet 0  . zoster vaccine live, PF, (ZOSTAVAX) 91478 UNT/0.65ML injection Inject 19,400 Units into the skin once. (Patient not taking: Reported  on 05/12/2014) 1 vial 0   No facility-administered medications prior to visit.    Allergies  Allergen Reactions  . Antihistamines, Diphenhydramine-Type Other (See Comments)    Pt can't recall  . Latex   . Penicillins     Brain swelling  . Prednisone Other (See Comments)    Unclear rxn to systemic steroids in the past and she will not take them again.    ROS Review of Systems  Constitutional: Positive for fatigue. Negative for fever, chills and appetite change.  HENT: Negative for congestion, dental problem, ear pain and sore throat.   Eyes: Negative for discharge, redness and visual disturbance.  Respiratory: Negative for cough, chest tightness, shortness of breath and wheezing.   Cardiovascular: Negative for chest pain, palpitations and leg swelling.  Gastrointestinal: Negative for nausea, vomiting, abdominal pain, diarrhea and blood in stool.  Genitourinary: Negative for dysuria, urgency, frequency, hematuria, flank pain and difficulty urinating.  Musculoskeletal: Negative for myalgias, back pain, joint swelling, arthralgias and neck stiffness.  Skin: Negative for pallor and rash.  Neurological: Negative for dizziness, speech difficulty, weakness and headaches.  Hematological: Negative for adenopathy. Does not bruise/bleed easily.  Psychiatric/Behavioral: Negative for confusion and sleep disturbance. The patient is not nervous/anxious.     PE; Blood pressure 152/110, pulse 70, temperature 98.6 F (37 C), temperature source Temporal, resp. rate 18, height 5\' 3"  (1.6 m), weight 207 lb (93.895 kg), SpO2 97 %. Gen: Alert, well appearing.  Patient is oriented to person, place, time, and situation. AFFECT: pleasant, lucid thought and speech. ENT: Ears: EACs clear, normal epithelium.  TMs with good light reflex and landmarks bilaterally.  Eyes: no injection, icteris, swelling, or exudate.  EOMI, PERRLA. Nose: no drainage or turbinate edema/swelling.  No injection or focal lesion.   Mouth: lips without lesion/swelling.  Oral mucosa pink and moist.  Dentition intact and without obvious caries or gingival swelling.  Oropharynx without erythema, exudate, or swelling.  Neck: supple/nontender.  No LAD, mass, or TM.  Carotid pulses 2+ bilaterally, without bruits. CV: RRR, no m/r/g.   LUNGS: CTA bilat, nonlabored resps, good aeration in all lung fields. ABD: soft, NT, ND, BS normal.  No hepatospenomegaly or mass.  No bruits. EXT: no clubbing, cyanosis, or edema.  Musculoskeletal: no joint swelling, erythema, warmth, or tenderness.  ROM of all joints intact.  Skin - no sores or suspicious lesions or rashes or color changes  Pertinent labs:  Lab Results  Component Value Date   TSH 7.00* 04/23/2014   Lab Results  Component Value Date   WBC 10.2 04/23/2014   HGB 13.8 04/23/2014   HCT 42.3 04/23/2014   MCV 91.8 04/23/2014   PLT 218.0 04/23/2014     Chemistry      Component Value Date/Time   NA 138 04/23/2014 0920   K 4.2 04/23/2014 0920   CL 106 04/23/2014 0920   CO2 25 04/23/2014 0920   BUN 13 04/23/2014 0920   CREATININE 0.8 04/23/2014 0920   CREATININE 0.78 12/07/2011 1330      Component Value Date/Time   CALCIUM 9.8 04/23/2014 0920   ALKPHOS 173* 04/23/2014 0920   AST 86* 04/23/2014 0920   ALT 99* 04/23/2014 0920   BILITOT 0.5 04/23/2014 0920     Lab Results  Component Value Date   CHOL 194 04/23/2014   HDL 43.10 04/23/2014   LDLCALC 121* 04/23/2014   TRIG 149.0 04/23/2014   CHOLHDL 5 04/23/2014     ASSESSMENT AND PLAN:   Hypothyroidism TSH up a little + pt symptomatic. Increase levothyroxine from 75 mcg to 100 mcg qd and recheck TSH in 2 mo.  Hyperlipidemia The current medical regimen is effective;  continue present plan and medications. LDL could be better but pt states she is restarting a vigorous exercise regimen so she does not want to go up on her statin dose w/out seeing what at least 6 mo of this exercise does for her lipid  levels. Recheck FLP 52mo.  Elevated transaminase level Plus elevated alkaline phosphatase level. Hx of cholecystectomy. No GI symptomatology. +Hx of hyperlipidemia and currently on statin.  Viral hep screening neg in 2014. Will check abd u/s to further eval for hepatic steatosis.  Health maintenance examination Reviewed age and gender appropriate health maintenance issues (prudent diet, regular exercise, health risks of tobacco and excessive alcohol, use of seatbelts, fire alarms in home, use of sunscreen).  Also reviewed age and gender appropriate health screening as well as vaccine recommendations. No vaccines due. Colon cancer screening UTD. Not a cervical ca screening candidate due to hx of hysterectomy for non-malignant reasons.  Mammogram UTD. HP labs UTD/reviewed.   An After Visit Summary was printed and given to the patient.  FOLLOW UP:  Return in about 6 months (around 11/11/2014) for routine chronic illness f/u; also, lab visit in 11mo for TSH.

## 2014-05-12 NOTE — Assessment & Plan Note (Signed)
The current medical regimen is effective;  continue present plan and medications. LDL could be better but pt states she is restarting a vigorous exercise regimen so she does not want to go up on her statin dose w/out seeing what at least 6 mo of this exercise does for her lipid levels. Recheck FLP 12mo.

## 2014-05-12 NOTE — Assessment & Plan Note (Signed)
TSH up a little + pt symptomatic. Increase levothyroxine from 75 mcg to 100 mcg qd and recheck TSH in 2 mo.

## 2014-05-12 NOTE — Progress Notes (Signed)
Pre visit review using our clinic review tool, if applicable. No additional management support is needed unless otherwise documented below in the visit note. 

## 2014-05-13 ENCOUNTER — Other Ambulatory Visit: Payer: Self-pay | Admitting: Family Medicine

## 2014-05-13 ENCOUNTER — Ambulatory Visit (HOSPITAL_BASED_OUTPATIENT_CLINIC_OR_DEPARTMENT_OTHER)
Admission: RE | Admit: 2014-05-13 | Discharge: 2014-05-13 | Disposition: A | Discharge status: Home / Self Care | Payer: BC Managed Care – PPO | Source: Ambulatory Visit | Attending: Family Medicine | Admitting: Family Medicine

## 2014-05-13 DIAGNOSIS — K760 Fatty (change of) liver, not elsewhere classified: Secondary | ICD-10-CM | POA: Insufficient documentation

## 2014-05-13 DIAGNOSIS — R7401 Elevation of levels of liver transaminase levels: Secondary | ICD-10-CM

## 2014-05-13 DIAGNOSIS — N200 Calculus of kidney: Secondary | ICD-10-CM | POA: Insufficient documentation

## 2014-05-13 DIAGNOSIS — K2970 Gastritis, unspecified, without bleeding: Secondary | ICD-10-CM | POA: Insufficient documentation

## 2014-05-13 DIAGNOSIS — K219 Gastro-esophageal reflux disease without esophagitis: Secondary | ICD-10-CM | POA: Insufficient documentation

## 2014-05-13 DIAGNOSIS — N1330 Unspecified hydronephrosis: Secondary | ICD-10-CM | POA: Diagnosis not present

## 2014-05-13 DIAGNOSIS — R945 Abnormal results of liver function studies: Secondary | ICD-10-CM | POA: Diagnosis not present

## 2014-05-13 DIAGNOSIS — R740 Nonspecific elevation of levels of transaminase and lactic acid dehydrogenase [LDH]: Secondary | ICD-10-CM | POA: Insufficient documentation

## 2014-05-13 DIAGNOSIS — R748 Abnormal levels of other serum enzymes: Secondary | ICD-10-CM | POA: Insufficient documentation

## 2014-05-13 DIAGNOSIS — Z90710 Acquired absence of both cervix and uterus: Secondary | ICD-10-CM | POA: Diagnosis not present

## 2014-05-20 ENCOUNTER — Encounter: Payer: Self-pay | Admitting: Family Medicine

## 2014-05-20 ENCOUNTER — Other Ambulatory Visit: Payer: Self-pay | Admitting: Family Medicine

## 2014-05-20 DIAGNOSIS — N1330 Unspecified hydronephrosis: Secondary | ICD-10-CM

## 2014-05-20 DIAGNOSIS — N135 Crossing vessel and stricture of ureter without hydronephrosis: Secondary | ICD-10-CM

## 2014-05-26 ENCOUNTER — Ambulatory Visit (HOSPITAL_BASED_OUTPATIENT_CLINIC_OR_DEPARTMENT_OTHER)
Admission: RE | Admit: 2014-05-26 | Discharge: 2014-05-26 | Disposition: A | Discharge status: Home / Self Care | Payer: BC Managed Care – PPO | Source: Ambulatory Visit | Attending: Family Medicine | Admitting: Family Medicine

## 2014-05-26 DIAGNOSIS — K760 Fatty (change of) liver, not elsewhere classified: Secondary | ICD-10-CM | POA: Insufficient documentation

## 2014-05-26 DIAGNOSIS — R319 Hematuria, unspecified: Secondary | ICD-10-CM | POA: Insufficient documentation

## 2014-05-26 DIAGNOSIS — N135 Crossing vessel and stricture of ureter without hydronephrosis: Secondary | ICD-10-CM

## 2014-05-26 DIAGNOSIS — N1330 Unspecified hydronephrosis: Secondary | ICD-10-CM | POA: Diagnosis not present

## 2014-05-26 DIAGNOSIS — Z90710 Acquired absence of both cervix and uterus: Secondary | ICD-10-CM | POA: Insufficient documentation

## 2014-05-26 DIAGNOSIS — N200 Calculus of kidney: Secondary | ICD-10-CM | POA: Diagnosis not present

## 2014-05-26 DIAGNOSIS — I7090 Unspecified atherosclerosis: Secondary | ICD-10-CM | POA: Insufficient documentation

## 2014-05-27 ENCOUNTER — Other Ambulatory Visit: Payer: Self-pay | Admitting: Family Medicine

## 2014-05-27 DIAGNOSIS — N1330 Unspecified hydronephrosis: Secondary | ICD-10-CM

## 2014-05-27 DIAGNOSIS — N135 Crossing vessel and stricture of ureter without hydronephrosis: Secondary | ICD-10-CM

## 2014-07-01 ENCOUNTER — Other Ambulatory Visit: Payer: Self-pay | Admitting: Urology

## 2014-07-02 ENCOUNTER — Encounter (HOSPITAL_BASED_OUTPATIENT_CLINIC_OR_DEPARTMENT_OTHER): Payer: Self-pay | Admitting: *Deleted

## 2014-07-02 NOTE — Progress Notes (Signed)
NPO AFTER MN. ARRIVE AT 0830. NEEDS ISTAT AND EKG. WILL TAKE AM MEDS W/ SIPS OF WATER DOS.

## 2014-07-05 ENCOUNTER — Encounter (HOSPITAL_BASED_OUTPATIENT_CLINIC_OR_DEPARTMENT_OTHER): Payer: Self-pay | Admitting: *Deleted

## 2014-07-06 ENCOUNTER — Encounter: Payer: Self-pay | Admitting: Family Medicine

## 2014-07-06 NOTE — H&P (Signed)
History of Present Illness   Madeline Dyer is sent to me today as a consultation request from Dr Ernestine Conrad for further assessment of some right-sided hydronephrosis with questionable abnormality/mass at her right ureterovesical junction as well as some left-sided nephrolithiasis. Madeline Dyer is currently 64 years of age. She has a prior history of nephrolithiasis. She was cared for in [place]. She tells me she had a "massive" stone in her left kidney. We do not have any idea what the original size was, but she was treated with lithotripsy as opposed to percutaneous nephrolithotomy. She apparently underwent 2 lithotripsies, but then left [place] area before she could have definitive followup. Clinically, she had not had any problems with the stones. She has had some ongoing issues with some bladder overactivity, but really has not had any other urologic issues.   She did have development of some elevation in her liver enzymes. Apparently, her mom did develop severe cirrhosis without a history of alcohol consumption. As part of her assessment, she underwent a renal ultrasonography. This demonstrated at least mild right-sided hydronephrosis without any renal calculi on that side. Creatinine was normal at 0.8. She was felt to have several stones in her left kidney measuring 7-9 mm in size. Because of the mild hydronephrosis without a definitive etiology, she underwent CT of the abdomen and pelvis without contrast. There appeared to be, again, some mild right-sided hydronephrosis, which terminated at the right ureterovesical junction. In the distal ureter, there was a very small focus of hypodensity. The radiologist was uncertain whether that could be a very small calculus or whether it could represent a little calcified urothelial lesion. They suggested correlation with cystoscopy and direct visual evaluation of the right ureteral orifice. Again, she was noted have multiple non-obstructing stones in the left renal  collecting system measuring up to 8 mm in size. Unclear whether those are new stones or leftover pieces from her previous lithotripsy.    Past Medical History Problems  1. History of Anxiety (F41.9) 2. History of acute myocardial infarction (I25.2) 3. History of arthritis (Z87.39) 4. History of cardiac murmur (Z86.79) 5. History of esophageal reflux (Z87.19) 6. History of gastric ulcer (Z87.19) 7. History of hepatic disease (Z87.19) 8. History of hypercholesterolemia (Z86.39) 9. History of hypothyroidism (Z86.39) 10. History of malignant neoplasm of skin (Z85.828) 11. History of renal calculi (Z61.096)  Surgical History Problems  1. History of Aortic Valve Replacement 2. History of Cesarean Section 3. History of Dermatological Surgery 4. History of Hernia Repair 5. History of Hysterectomy 6. History of Lithotripsy  Current Meds 1. Allegra TABS;  Therapy: (Recorded:21Jan2016) to Recorded 2. Aspirin 81 MG Oral Tablet;  Therapy: (Recorded:21Jan2016) to Recorded 3. Atenolol 25 MG Oral Tablet;  Therapy: (Recorded:21Jan2016) to Recorded 4. Atorvastatin Calcium 20 MG Oral Tablet;  Therapy: (Recorded:21Jan2016) to Recorded 5. Escitalopram Oxalate 20 MG Oral Tablet;  Therapy: (Recorded:21Jan2016) to Recorded 6. Fish Oil CAPS;  Therapy: (Recorded:21Jan2016) to Recorded 7. Multiple Vitamin TABS;  Therapy: (Recorded:21Jan2016) to Recorded 8. Omeprazole 40 MG Oral Capsule Delayed Release;  Therapy: (Recorded:21Jan2016) to Recorded 9. Synthroid 100 MCG Oral Tablet;  Therapy: (Recorded:21Jan2016) to Recorded 10. Turmeric 450 MG Oral Capsule;   Therapy: (Recorded:21Jan2016) to Recorded  Allergies Medication  1. Antihistamine TABS 2. Penicillins Non-Medication  3. Latex  Family History Problems  1. Family history of Deceased : Mother, Father 2. Family history of aortic aneurysm (Z82.49) : Father 3. Family history of kidney stones (Z84.1) : Mother 4. Family history of  malignant neoplasm of thyroid (Z80.8) :  Mother 5. Family history of prostate cancer (Z80.42) : Brother  Social History Problems    Alcohol use (F10.99)   Caffeine use (F15.90)   Former smoker [phone])   Former smoker (Z87.891)   Housewife or homemaker   Married  Review of Systems Genitourinary, constitutional, skin, eye, otolaryngeal, hematologic/lymphatic, cardiovascular, pulmonary, endocrine, musculoskeletal, gastrointestinal, neurological and psychiatric system(s) were reviewed and pertinent findings if present are noted and are otherwise negative.  Genitourinary: urinary urgency, nocturia and incontinence.  Gastrointestinal: heartburn and diarrhea.  Constitutional: feeling tired (fatigue).  Integumentary: skin rash/lesion and pruritus.  Eyes: blurred vision.  ENT: sinus problems.  Respiratory: shortness of breath.  Musculoskeletal: back pain and joint pain.  Neurological: dizziness and headache.  Psychiatric: anxiety.    Vitals Vital Signs [Data Includes: Last 1 Day]  Recorded: 21Jan2016 12:20PM  Height: 5 ft 3.5 in Weight: 201 lb  BMI Calculated: 35.05 BSA Calculated: 1.95 Blood Pressure: 131 / 79 Heart Rate: 59  Physical Exam Constitutional: Well nourished and well developed . No acute distress.  ENT:. The ears and nose are normal in appearance.  Pulmonary: No respiratory distress and normal respiratory rhythm and effort.  Cardiovascular: Heart rate and rhythm are normal . No peripheral edema.  Abdomen: The abdomen is soft and nontender. No masses are palpated. No CVA tenderness. No hernias are palpable. No hepatosplenomegaly noted.  Skin: Normal skin turgor, no visible rash and no visible skin lesions.  Neuro/Psych:. Mood and affect are appropriate.    Results/Data Urine [Data Includes: Last 1 Day]   [id number]  COLOR YELLOW   APPEARANCE CLEAR   SPECIFIC GRAVITY <1.005   pH 6.0   GLUCOSE NEG mg/dL  BILIRUBIN NEG   KETONE NEG mg/dL  BLOOD NEG    PROTEIN NEG mg/dL  UROBILINOGEN 0.2 mg/dL  NITRITE NEG   LEUKOCYTE ESTERASE NEG    Assessment Assessed  1. Hydronephrosis (N13.30) 2. Nephrolithiasis (N20.0) 3. Ureteral mass (N28.9)  Plan Health Maintenance  1. UA With REFLEX; [Do Not Release]; Status:Complete;   Done: [id number] 12:06PM Ureteral mass  2. Follow-up Schedule Surgery Office  Follow-up  Status: Hold For - Appointment   Requested for: 21Jan2016 3. Get Outside Records Office  Follow-up  Status: Hold For - Records,Records Release   Requested for: 21Jan2016  Discussion/Summary   Madeline Dyer has had asymptomatic mild hydronephrosis with a questionable distal right ureter abnormality. Given the findings on CT, I agree with radiology that it is important that we directly evaluate that area. It is certainly conceivable we will find nothing at all. I would suggest to her that the most prudent evaluation for her would be cystoscopy with retrograde pyelogram and direct vision of her distal right ureter. If there is a small stone piece, we certainly should be able to remove it. If there is an abnormality, this could be biopsied or potentially even taken care of definitively. She would probably require a double-J stent status post that procedure. She is interested potentially in having her left renal calculi definitively managed at the same time. She was told we would be happy to consider that, but a lot would depend on how everything went on the right side. We would make the judgement at the time of surgery whether it was prudent to proceed with flexible ureteroscopy in an attempt to treat the left-sided renal calculi. There are times where it could be difficult to get up the ureter and, therefore, we certainly cannot promise success in dealing with that, but if we can get  up to the left collecting system we could hopefully perform laser lithotripsy on the remaining stones/fragments in the left kidney all of which would be potentially difficult  to pass spontaneously. It would be helpful to get records from her outside urologist to determine the size and location of her initial stone and whether she has ever had stone analysis performed.   cc: Shawnie Dapper, MD    Signatures Electronically signed by : Rana Snare, M.D.; Jul 05 2014 11:19AM EST

## 2014-07-07 ENCOUNTER — Ambulatory Visit (HOSPITAL_BASED_OUTPATIENT_CLINIC_OR_DEPARTMENT_OTHER): Payer: 59 | Admitting: Anesthesiology

## 2014-07-07 ENCOUNTER — Other Ambulatory Visit: Payer: Self-pay

## 2014-07-07 ENCOUNTER — Ambulatory Visit (HOSPITAL_BASED_OUTPATIENT_CLINIC_OR_DEPARTMENT_OTHER)
Admission: RE | Admit: 2014-07-07 | Discharge: 2014-07-07 | Disposition: A | Discharge status: Home / Self Care | Payer: 59 | Source: Ambulatory Visit | Attending: Urology | Admitting: Urology

## 2014-07-07 ENCOUNTER — Other Ambulatory Visit: Payer: BC Managed Care – PPO

## 2014-07-07 ENCOUNTER — Encounter (HOSPITAL_BASED_OUTPATIENT_CLINIC_OR_DEPARTMENT_OTHER): Payer: Self-pay | Admitting: *Deleted

## 2014-07-07 ENCOUNTER — Encounter (HOSPITAL_BASED_OUTPATIENT_CLINIC_OR_DEPARTMENT_OTHER)
Admission: RE | Disposition: A | Discharge status: Home / Self Care | Payer: Self-pay | Source: Ambulatory Visit | Attending: Urology

## 2014-07-07 DIAGNOSIS — M1990 Unspecified osteoarthritis, unspecified site: Secondary | ICD-10-CM | POA: Insufficient documentation

## 2014-07-07 DIAGNOSIS — Z952 Presence of prosthetic heart valve: Secondary | ICD-10-CM | POA: Insufficient documentation

## 2014-07-07 DIAGNOSIS — E780 Pure hypercholesterolemia: Secondary | ICD-10-CM | POA: Diagnosis not present

## 2014-07-07 DIAGNOSIS — K219 Gastro-esophageal reflux disease without esophagitis: Secondary | ICD-10-CM | POA: Diagnosis not present

## 2014-07-07 DIAGNOSIS — N200 Calculus of kidney: Secondary | ICD-10-CM

## 2014-07-07 DIAGNOSIS — Z7982 Long term (current) use of aspirin: Secondary | ICD-10-CM | POA: Diagnosis not present

## 2014-07-07 DIAGNOSIS — K831 Obstruction of bile duct: Secondary | ICD-10-CM | POA: Diagnosis not present

## 2014-07-07 DIAGNOSIS — Z87891 Personal history of nicotine dependence: Secondary | ICD-10-CM | POA: Insufficient documentation

## 2014-07-07 DIAGNOSIS — I252 Old myocardial infarction: Secondary | ICD-10-CM | POA: Diagnosis not present

## 2014-07-07 DIAGNOSIS — Z888 Allergy status to other drugs, medicaments and biological substances status: Secondary | ICD-10-CM | POA: Insufficient documentation

## 2014-07-07 DIAGNOSIS — N132 Hydronephrosis with renal and ureteral calculous obstruction: Secondary | ICD-10-CM | POA: Insufficient documentation

## 2014-07-07 DIAGNOSIS — Z880 Allergy status to penicillin: Secondary | ICD-10-CM | POA: Insufficient documentation

## 2014-07-07 DIAGNOSIS — Z85828 Personal history of other malignant neoplasm of skin: Secondary | ICD-10-CM | POA: Insufficient documentation

## 2014-07-07 DIAGNOSIS — F419 Anxiety disorder, unspecified: Secondary | ICD-10-CM | POA: Diagnosis not present

## 2014-07-07 DIAGNOSIS — Z91040 Latex allergy status: Secondary | ICD-10-CM | POA: Insufficient documentation

## 2014-07-07 DIAGNOSIS — E039 Hypothyroidism, unspecified: Secondary | ICD-10-CM | POA: Diagnosis not present

## 2014-07-07 DIAGNOSIS — R011 Cardiac murmur, unspecified: Secondary | ICD-10-CM | POA: Diagnosis not present

## 2014-07-07 DIAGNOSIS — N1330 Unspecified hydronephrosis: Secondary | ICD-10-CM

## 2014-07-07 HISTORY — DX: Other specified postprocedural states: Z98.890

## 2014-07-07 HISTORY — DX: Unspecified osteoarthritis, unspecified site: M19.90

## 2014-07-07 HISTORY — DX: Hypothyroidism, unspecified: E03.9

## 2014-07-07 HISTORY — DX: Presence of xenogenic heart valve: Z95.3

## 2014-07-07 HISTORY — DX: Other specified postprocedural states: Z85.9

## 2014-07-07 HISTORY — DX: Personal history of traumatic brain injury: Z87.820

## 2014-07-07 HISTORY — PX: HOLMIUM LASER APPLICATION: SHX5852

## 2014-07-07 HISTORY — DX: Personal history of (corrected) congenital malformations of heart and circulatory system: Z87.74

## 2014-07-07 HISTORY — DX: Lactose intolerance, unspecified: E73.9

## 2014-07-07 HISTORY — PX: STONE EXTRACTION WITH BASKET: SHX5318

## 2014-07-07 HISTORY — DX: Unspecified injury of muscle(s) and tendon(s) of the rotator cuff of right shoulder, initial encounter: S46.001A

## 2014-07-07 HISTORY — DX: Unspecified hydronephrosis: N13.30

## 2014-07-07 HISTORY — DX: Personal history of other diseases of the digestive system: Z87.19

## 2014-07-07 HISTORY — DX: Dermatitis, unspecified: L30.9

## 2014-07-07 HISTORY — PX: CYSTOSCOPY WITH RETROGRADE PYELOGRAM, URETEROSCOPY AND STENT PLACEMENT: SHX5789

## 2014-07-07 LAB — POCT I-STAT 4, (NA,K, GLUC, HGB,HCT)
Glucose, Bld: 110 mg/dL — ABNORMAL HIGH (ref 70–99)
HCT: 41 % (ref 36.0–46.0)
HEMOGLOBIN: 13.9 g/dL (ref 12.0–15.0)
POTASSIUM: 4.2 mmol/L (ref 3.5–5.1)
Sodium: 142 mmol/L (ref 135–145)

## 2014-07-07 SURGERY — CYSTOURETEROSCOPY, WITH RETROGRADE PYELOGRAM AND STENT INSERTION
Anesthesia: General | Site: Ureter | Laterality: Left

## 2014-07-07 MED ORDER — LIDOCAINE HCL (CARDIAC) 20 MG/ML IV SOLN
INTRAVENOUS | Status: DC | PRN
  Administered 2014-07-07: 80 mg via INTRAVENOUS

## 2014-07-07 MED ORDER — BELLADONNA ALKALOIDS-OPIUM 16.2-60 MG RE SUPP
RECTAL | Status: DC | PRN
  Administered 2014-07-07: 1 via RECTAL

## 2014-07-07 MED ORDER — EPHEDRINE SULFATE 50 MG/ML IJ SOLN
INTRAMUSCULAR | Status: DC | PRN
  Administered 2014-07-07 (×2): 5 mg via INTRAVENOUS

## 2014-07-07 MED ORDER — LIDOCAINE HCL 2 % EX GEL
CUTANEOUS | Status: DC | PRN
  Administered 2014-07-07: 1 via URETHRAL

## 2014-07-07 MED ORDER — MIDAZOLAM HCL 2 MG/2ML IJ SOLN
INTRAMUSCULAR | Status: AC
  Filled 2014-07-07: qty 2

## 2014-07-07 MED ORDER — MIDAZOLAM HCL 5 MG/5ML IJ SOLN
INTRAMUSCULAR | Status: DC | PRN
Start: 2014-07-07 — End: 2014-07-07
  Administered 2014-07-07: 2 mg via INTRAVENOUS

## 2014-07-07 MED ORDER — FENTANYL CITRATE 0.05 MG/ML IJ SOLN
INTRAMUSCULAR | Status: DC | PRN
  Administered 2014-07-07: 100 ug via INTRAVENOUS
  Administered 2014-07-07: 50 ug via INTRAVENOUS

## 2014-07-07 MED ORDER — HYDROCODONE-ACETAMINOPHEN 5-325 MG PO TABS
ORAL_TABLET | ORAL | Status: AC
  Filled 2014-07-07: qty 1

## 2014-07-07 MED ORDER — PROMETHAZINE HCL 25 MG/ML IJ SOLN
6.2500 mg | INTRAMUSCULAR | Status: DC | PRN
  Filled 2014-07-07: qty 1

## 2014-07-07 MED ORDER — FENTANYL CITRATE 0.05 MG/ML IJ SOLN
INTRAMUSCULAR | Status: AC
  Filled 2014-07-07: qty 4

## 2014-07-07 MED ORDER — URELLE 81 MG PO TABS
1.0000 | ORAL_TABLET | Freq: Once | ORAL | Status: AC
Start: 2014-07-07 — End: 2014-07-07
  Administered 2014-07-07: 81 mg via ORAL
  Filled 2014-07-07: qty 1

## 2014-07-07 MED ORDER — BELLADONNA ALKALOIDS-OPIUM 16.2-60 MG RE SUPP
RECTAL | Status: AC
  Filled 2014-07-07: qty 1

## 2014-07-07 MED ORDER — CIPROFLOXACIN IN D5W 400 MG/200ML IV SOLN
400.0000 mg | INTRAVENOUS | Status: AC
  Administered 2014-07-07: 400 mg via INTRAVENOUS
  Filled 2014-07-07: qty 200

## 2014-07-07 MED ORDER — HYDROMORPHONE HCL 1 MG/ML IJ SOLN
0.2500 mg | INTRAMUSCULAR | Status: DC | PRN
  Administered 2014-07-07: 0.25 mg via INTRAVENOUS
  Filled 2014-07-07: qty 1

## 2014-07-07 MED ORDER — KETOROLAC TROMETHAMINE 30 MG/ML IJ SOLN
INTRAMUSCULAR | Status: DC | PRN
  Administered 2014-07-07: 30 mg via INTRAVENOUS

## 2014-07-07 MED ORDER — HYDROMORPHONE HCL 1 MG/ML IJ SOLN
INTRAMUSCULAR | Status: AC
  Filled 2014-07-07: qty 1

## 2014-07-07 MED ORDER — IOHEXOL 350 MG/ML SOLN
INTRAVENOUS | Status: DC | PRN
  Administered 2014-07-07: 10 mL

## 2014-07-07 MED ORDER — HYDROCODONE-ACETAMINOPHEN 5-325 MG PO TABS: 1.0000 | ORAL_TABLET | Freq: Four times a day (QID) | ORAL | Status: DC | PRN

## 2014-07-07 MED ORDER — LACTATED RINGERS IV SOLN
INTRAVENOUS | Status: DC
  Administered 2014-07-07 (×2): via INTRAVENOUS
  Filled 2014-07-07: qty 1000

## 2014-07-07 MED ORDER — HYDROCODONE-ACETAMINOPHEN 5-325 MG PO TABS
1.0000 | ORAL_TABLET | Freq: Once | ORAL | Status: AC
  Administered 2014-07-07: 1 via ORAL
  Filled 2014-07-07: qty 2

## 2014-07-07 MED ORDER — ONDANSETRON HCL 4 MG/2ML IJ SOLN
INTRAMUSCULAR | Status: DC | PRN
  Administered 2014-07-07: 4 mg via INTRAVENOUS

## 2014-07-07 MED ORDER — SODIUM CHLORIDE 0.9 % IR SOLN
Status: DC | PRN
  Administered 2014-07-07: 4000 mL

## 2014-07-07 MED ORDER — CIPROFLOXACIN IN D5W 400 MG/200ML IV SOLN
INTRAVENOUS | Status: AC
  Filled 2014-07-07: qty 200

## 2014-07-07 MED ORDER — URIBEL 118 MG PO CAPS: 1.0000 | ORAL_CAPSULE | Freq: Three times a day (TID) | ORAL | Status: AC | PRN

## 2014-07-07 MED ORDER — URELLE 81 MG PO TABS
ORAL_TABLET | ORAL | Status: AC
  Filled 2014-07-07: qty 1

## 2014-07-07 MED ORDER — PROPOFOL 10 MG/ML IV BOLUS
INTRAVENOUS | Status: DC | PRN
  Administered 2014-07-07: 160 mg via INTRAVENOUS

## 2014-07-07 SURGICAL SUPPLY — 37 items
ADAPTER CATH URET PLST 4-6FR (CATHETERS) IMPLANT
BAG DRAIN URO-CYSTO SKYTR STRL (DRAIN) ×4 IMPLANT
BASKET LASER NITINOL 1.9FR (BASKET) IMPLANT
BASKET STNLS GEMINI 4WIRE 3FR (BASKET) IMPLANT
BASKET ZERO TIP NITINOL 2.4FR (BASKET) ×4 IMPLANT
BENZOIN TINCTURE PRP APPL 2/3 (GAUZE/BANDAGES/DRESSINGS) IMPLANT
CANISTER SUCT LVC 12 LTR MEDI- (MISCELLANEOUS) ×4 IMPLANT
CATH INTERMIT  6FR 70CM (CATHETERS) ×4 IMPLANT
CATH URET 5FR 28IN CONE TIP (BALLOONS) ×2
CATH URET 5FR 28IN OPEN ENDED (CATHETERS) IMPLANT
CATH URET 5FR 70CM CONE TIP (BALLOONS) ×2 IMPLANT
CLOTH BEACON ORANGE TIMEOUT ST (SAFETY) ×4 IMPLANT
DRAPE CAMERA CLOSED 9X96 (DRAPES) ×4 IMPLANT
DRSG TEGADERM 2-3/8X2-3/4 SM (GAUZE/BANDAGES/DRESSINGS) IMPLANT
FIBER LASER FLEXIVA 1000 (UROLOGICAL SUPPLIES) IMPLANT
FIBER LASER FLEXIVA 200 (UROLOGICAL SUPPLIES) IMPLANT
FIBER LASER FLEXIVA 365 (UROLOGICAL SUPPLIES) IMPLANT
FIBER LASER FLEXIVA 550 (UROLOGICAL SUPPLIES) IMPLANT
FIBER LASER TRAC TIP (UROLOGICAL SUPPLIES) ×4 IMPLANT
GLOVE BIO SURGEON STRL SZ7.5 (GLOVE) IMPLANT
GLOVE SURG SS PI 7.5 STRL IVOR (GLOVE) ×12 IMPLANT
GOWN STRL REIN XL XLG (GOWN DISPOSABLE) IMPLANT
GOWN STRL REUS W/TWL LRG LVL3 (GOWN DISPOSABLE) ×8 IMPLANT
GUIDEWIRE 0.038 PTFE COATED (WIRE) IMPLANT
GUIDEWIRE ANG ZIPWIRE 038X150 (WIRE) IMPLANT
GUIDEWIRE STR DUAL SENSOR (WIRE) ×8 IMPLANT
IV NS 1000ML (IV SOLUTION) ×2
IV NS 1000ML BAXH (IV SOLUTION) ×2 IMPLANT
IV NS IRRIG 3000ML ARTHROMATIC (IV SOLUTION) ×4 IMPLANT
KIT BALLIN UROMAX 15FX10 (LABEL) IMPLANT
KIT BALLN UROMAX 15FX4 (MISCELLANEOUS) IMPLANT
KIT BALLN UROMAX 26 75X4 (MISCELLANEOUS)
NS IRRIG 500ML POUR BTL (IV SOLUTION) ×4 IMPLANT
PACK CYSTO (CUSTOM PROCEDURE TRAY) ×4 IMPLANT
SET HIGH PRES BAL DIL (LABEL)
SHEATH ACCESS URETERAL 38CM (SHEATH) ×4 IMPLANT
STENT URET 6FRX24 CONTOUR (STENTS) ×8 IMPLANT

## 2014-07-07 NOTE — Discharge Instructions (Addendum)

## 2014-07-07 NOTE — Transfer of Care (Signed)
Immediate Anesthesia Transfer of Care Note  Patient: Madeline Dyer  Procedure(s) Performed: Procedure(s):  BILATERAL CYSTOSCOPY WITH RETROGRADE PYELOGRAM, LEFT URETEROSCOPY, RIGHT DIAGNOSTIC URETEROSCOPY AND BILATERALSTENT PLACEMENT (Bilateral) HOLMIUM LASER APPLICATION (Left) STONE EXTRACTION WITH BASKET (Left)  Patient Location: PACU  Anesthesia Type:General  Level of Consciousness: awake, alert  and oriented  Airway & Oxygen Therapy: Patient Spontanous Breathing and Patient connected to nasal cannula oxygen  Post-op Assessment: Report given to PACU RN  Post vital signs: Reviewed and stable  Complications: No apparent anesthesia complications

## 2014-07-07 NOTE — Op Note (Signed)
Preoperative diagnosis: Right hydronephrosis with questionable right ureteral mass, left nephrolithiasis Postoperative diagnosis: Same, no right ureteral mass noted  Procedure: Cystoscopy, bilateral retrograde pyelography, right rigid diagnostic ureteroscopy, left flexible ureteroscopy with holmium laser lithotripsy and basketing of stone fragments, bilateral JJ stent placement   Surgeon: Bernestine Amass M.D.  Anesthesia: Gen.  Indications: Madeline Dyer was referred to our office recently secondary to some mild right-sided hydronephrosis and a questionable right distal ureteral abnormality. She was also noted to have some incidental nonobstructing left renal calculi. The patient had had several stone procedures in [place] for a large left renal stone. She undergone lithotripsy 2 left that stated before complete follow-up. She recently underwent abdominal imaging secondary to liver function test abnormalities. Mild right hydronephrosis was noted and multiple left renal calculi in the 79 mm size range were noted. On CT imaging with contrast there was a question of some slight narrowing at the right ureteral vesicle junction and a question of a possible distal ureteral mass were poorly visualized stone was noted. We suggested further assessment with cystoscopy retrograde pyelogram and ureteroscopy. The patient requested definitive management of her left renal stones if she was going to be put to sleep for assessment on the right side.     Technique and findings: Patient was brought the operating room where she had successful induction of general anesthesia. She was placed in lithotomy position and prepped and draped in usual manner. She had placement of PAS compression boots and received perioperative ciprofloxacin. Appropriate surgical timeout was performed. Cystoscopy was performed and the urethra and bladder were unremarkable. We saw no pathology around either ureteral orifice.   Right retrograde  pyelography was performed utilizing a cone-tipped catheter. I could appreciate no obvious filling defects or evidence of significant obstruction throughout the course of the ureter. There was slight distention of the right renal pelvis and calyceal system and a subtle question of a mild UPJ obstruction.   Pyelography on the left side showed no obvious abnormalities. Several stones were noted however on scout/fluoroscopy imaging.   A stent was placed to the right renal unit and the right distal ureter was engaged with a rigid ureteroscope. This was done after we performed one step dilation of the ureteral orifice with an access sheath. No obvious stones tumors or other abnormalities were appreciated in the distal ureter. A 6 French 24 cm double-J stent was then placed.   On the left side a access sheath was placed and a digital flexible ureteroscope was inserted. 27-8 mm stones were encountered in the lower pole. The stones were fractured into innumerable pieces utilizing a holmium laser lithotripter fiber. 0.5 J 5 Hz was utilized. Approximately 8-10 fragments were basket extracted. A similar size stent was placed on the left side. No obvious complications occurred and the patient was brought to recovery room in stable condition.

## 2014-07-07 NOTE — Anesthesia Preprocedure Evaluation (Addendum)
Anesthesia Evaluation  Patient identified by MRN, date of birth, ID band Patient awake    Reviewed: Allergy & Precautions, NPO status , reviewed documented beta blocker date and time   Airway        Dental   Pulmonary shortness of breath, former smoker,          Cardiovascular hypertension, + Valvular Problems/Murmurs AS  AVR surgery    Neuro/Psych    GI/Hepatic GERD-  ,Hepatic Stenosis   Endo/Other  Hypothyroidism   Renal/GU      Musculoskeletal  (+) Arthritis -,   Abdominal   Peds  Hematology   Anesthesia Other Findings   Reproductive/Obstetrics                           Anesthesia Physical Anesthesia Plan  ASA: II  Anesthesia Plan: General   Post-op Pain Management:    Induction: Intravenous  Airway Management Planned: LMA  Additional Equipment:   Intra-op Plan:   Post-operative Plan: Extubation in OR  Informed Consent: I have reviewed the patients History and Physical, chart, labs and discussed the procedure including the risks, benefits and alternatives for the proposed anesthesia with the patient or authorized representative who has indicated his/her understanding and acceptance.   Dental advisory given  Plan Discussed with: CRNA and Surgeon  Anesthesia Plan Comments:         Anesthesia Quick Evaluation

## 2014-07-07 NOTE — Interval H&P Note (Signed)
History and Physical Interval Note:  07/07/2014 10:06 AM  Madeline Dyer  has presented today for surgery, with the diagnosis of RIGHT HYDRONEPHROSIS, QUESTIONABLE RIGHT URETERAL MASS, LEFT RENAL CALCULUS  The various methods of treatment have been discussed with the patient and family. After consideration of risks, benefits and other options for treatment, the patient has consented to  Procedure(s): CYSTOSCOPY WITH RETROGRADE PYELOGRAM, URETEROSCOPY AND STENT PLACEMENT (Bilateral) HOLMIUM LASER APPLICATION (Bilateral) as a surgical intervention .  The patient's history has been reviewed, patient examined, no change in status, stable for surgery.  I have reviewed the patient's chart and labs.  Questions were answered to the patient's satisfaction.     [name] S

## 2014-07-07 NOTE — Anesthesia Procedure Notes (Signed)
Procedure Name: LMA Insertion Date/Time: 07/07/2014 10:13 AM Performed by: Bethena Roys T Pre-anesthesia Checklist: Patient identified, Emergency Drugs available, Suction available and Patient being monitored Patient Re-evaluated:Patient Re-evaluated prior to inductionOxygen Delivery Method: Circle System Utilized Preoxygenation: Pre-oxygenation with 100% oxygen Intubation Type: IV induction Ventilation: Mask ventilation without difficulty LMA: LMA with gastric port inserted LMA Size: 4.0 Number of attempts: 1 Placement Confirmation: positive ETCO2 Tube secured with: Tape Dental Injury: Teeth and Oropharynx as per pre-operative assessment

## 2014-07-08 ENCOUNTER — Encounter (HOSPITAL_BASED_OUTPATIENT_CLINIC_OR_DEPARTMENT_OTHER): Payer: Self-pay | Admitting: Urology

## 2014-07-08 NOTE — Anesthesia Postprocedure Evaluation (Signed)
  Anesthesia Post-op Note  Patient: Madeline Dyer  Procedure(s) Performed: Procedure(s):  BILATERAL CYSTOSCOPY WITH RETROGRADE PYELOGRAM, LEFT URETEROSCOPY, RIGHT DIAGNOSTIC URETEROSCOPY AND BILATERALSTENT PLACEMENT (Bilateral) HOLMIUM LASER APPLICATION (Left) STONE EXTRACTION WITH BASKET (Left)  Patient Location: PACU  Anesthesia Type:General  Level of Consciousness: awake and alert   Airway and Oxygen Therapy: Patient Spontanous Breathing  Post-op Pain: mild  Post-op Assessment: Post-op Vital signs reviewed  Post-op Vital Signs: stable  Last Vitals:  Filed Vitals:   07/07/14 1348  BP: 131/78  Pulse: 69  Temp: 36.9 C  Resp: 18    Complications: No apparent anesthesia complications

## 2014-07-16 ENCOUNTER — Encounter: Payer: Self-pay | Admitting: Family Medicine

## 2014-07-28 ENCOUNTER — Other Ambulatory Visit: Payer: Self-pay | Admitting: Family Medicine

## 2014-07-28 MED ORDER — LEVOTHYROXINE SODIUM 100 MCG PO TABS: 100.0000 ug | ORAL_TABLET | Freq: Every day | ORAL | Status: DC

## 2014-08-13 ENCOUNTER — Other Ambulatory Visit: Payer: Self-pay | Admitting: Family Medicine

## 2014-08-13 MED ORDER — OMEPRAZOLE 40 MG PO CPDR: 40.0000 mg | DELAYED_RELEASE_CAPSULE | Freq: Every day | ORAL | Status: DC

## 2014-09-30 ENCOUNTER — Other Ambulatory Visit: Payer: Self-pay | Admitting: Family Medicine

## 2014-10-04 ENCOUNTER — Encounter (HOSPITAL_COMMUNITY): Payer: Self-pay | Admitting: Emergency Medicine

## 2014-10-04 ENCOUNTER — Emergency Department (HOSPITAL_COMMUNITY): Payer: 59

## 2014-10-04 ENCOUNTER — Emergency Department (HOSPITAL_COMMUNITY)
Admission: EM | Admit: 2014-10-04 | Discharge: 2014-10-05 | Disposition: A | Discharge status: Home / Self Care | Payer: 59 | Attending: Emergency Medicine | Admitting: Emergency Medicine

## 2014-10-04 ENCOUNTER — Encounter: Payer: Self-pay | Admitting: Family Medicine

## 2014-10-04 ENCOUNTER — Ambulatory Visit (INDEPENDENT_AMBULATORY_CARE_PROVIDER_SITE_OTHER): Payer: 59 | Admitting: Family Medicine

## 2014-10-04 VITALS — BP 87/62 | HR 78 | Temp 98.3°F | Resp 22 | Ht 63.0 in | Wt 198.0 lb

## 2014-10-04 DIAGNOSIS — R011 Cardiac murmur, unspecified: Secondary | ICD-10-CM | POA: Diagnosis not present

## 2014-10-04 DIAGNOSIS — Z86010 Personal history of colonic polyps: Secondary | ICD-10-CM | POA: Diagnosis not present

## 2014-10-04 DIAGNOSIS — R109 Unspecified abdominal pain: Secondary | ICD-10-CM

## 2014-10-04 DIAGNOSIS — Z87442 Personal history of urinary calculi: Secondary | ICD-10-CM | POA: Insufficient documentation

## 2014-10-04 DIAGNOSIS — E876 Hypokalemia: Secondary | ICD-10-CM | POA: Diagnosis not present

## 2014-10-04 DIAGNOSIS — R112 Nausea with vomiting, unspecified: Secondary | ICD-10-CM | POA: Insufficient documentation

## 2014-10-04 DIAGNOSIS — R42 Dizziness and giddiness: Secondary | ICD-10-CM | POA: Insufficient documentation

## 2014-10-04 DIAGNOSIS — Z79899 Other long term (current) drug therapy: Secondary | ICD-10-CM | POA: Diagnosis not present

## 2014-10-04 DIAGNOSIS — R079 Chest pain, unspecified: Secondary | ICD-10-CM

## 2014-10-04 DIAGNOSIS — Z9889 Other specified postprocedural states: Secondary | ICD-10-CM | POA: Insufficient documentation

## 2014-10-04 DIAGNOSIS — R197 Diarrhea, unspecified: Secondary | ICD-10-CM | POA: Insufficient documentation

## 2014-10-04 DIAGNOSIS — Z954 Presence of other heart-valve replacement: Secondary | ICD-10-CM | POA: Insufficient documentation

## 2014-10-04 DIAGNOSIS — Z8739 Personal history of other diseases of the musculoskeletal system and connective tissue: Secondary | ICD-10-CM | POA: Diagnosis not present

## 2014-10-04 DIAGNOSIS — E785 Hyperlipidemia, unspecified: Secondary | ICD-10-CM | POA: Diagnosis not present

## 2014-10-04 DIAGNOSIS — Z880 Allergy status to penicillin: Secondary | ICD-10-CM | POA: Insufficient documentation

## 2014-10-04 DIAGNOSIS — R1013 Epigastric pain: Secondary | ICD-10-CM | POA: Insufficient documentation

## 2014-10-04 DIAGNOSIS — Z872 Personal history of diseases of the skin and subcutaneous tissue: Secondary | ICD-10-CM | POA: Diagnosis not present

## 2014-10-04 DIAGNOSIS — E039 Hypothyroidism, unspecified: Secondary | ICD-10-CM | POA: Insufficient documentation

## 2014-10-04 DIAGNOSIS — Z87828 Personal history of other (healed) physical injury and trauma: Secondary | ICD-10-CM | POA: Insufficient documentation

## 2014-10-04 DIAGNOSIS — Z7982 Long term (current) use of aspirin: Secondary | ICD-10-CM | POA: Diagnosis not present

## 2014-10-04 DIAGNOSIS — R5383 Other fatigue: Secondary | ICD-10-CM | POA: Diagnosis not present

## 2014-10-04 DIAGNOSIS — Z91040 Latex allergy status: Secondary | ICD-10-CM | POA: Insufficient documentation

## 2014-10-04 DIAGNOSIS — E860 Dehydration: Secondary | ICD-10-CM | POA: Diagnosis not present

## 2014-10-04 DIAGNOSIS — R531 Weakness: Secondary | ICD-10-CM | POA: Insufficient documentation

## 2014-10-04 DIAGNOSIS — Z87891 Personal history of nicotine dependence: Secondary | ICD-10-CM | POA: Insufficient documentation

## 2014-10-04 DIAGNOSIS — M1990 Unspecified osteoarthritis, unspecified site: Secondary | ICD-10-CM | POA: Insufficient documentation

## 2014-10-04 DIAGNOSIS — K219 Gastro-esophageal reflux disease without esophagitis: Secondary | ICD-10-CM | POA: Diagnosis not present

## 2014-10-04 LAB — CBC WITH DIFFERENTIAL/PLATELET
BASOS PCT: 0 % (ref 0–1)
Basophils Absolute: 0 10*3/uL (ref 0.0–0.1)
Eosinophils Absolute: 0.3 10*3/uL (ref 0.0–0.7)
Eosinophils Relative: 3 % (ref 0–5)
HCT: 38.1 % (ref 36.0–46.0)
Hemoglobin: 13.4 g/dL (ref 12.0–15.0)
LYMPHS PCT: 23 % (ref 12–46)
Lymphs Abs: 2.6 10*3/uL (ref 0.7–4.0)
MCH: 30 pg (ref 26.0–34.0)
MCHC: 35.2 g/dL (ref 30.0–36.0)
MCV: 85.4 fL (ref 78.0–100.0)
MONOS PCT: 10 % (ref 3–12)
Monocytes Absolute: 1.1 10*3/uL — ABNORMAL HIGH (ref 0.1–1.0)
NEUTROS PCT: 64 % (ref 43–77)
Neutro Abs: 7.4 10*3/uL (ref 1.7–7.7)
PLATELETS: 114 10*3/uL — AB (ref 150–400)
RBC: 4.46 MIL/uL (ref 3.87–5.11)
RDW: 13.7 % (ref 11.5–15.5)
WBC: 11.4 10*3/uL — ABNORMAL HIGH (ref 4.0–10.5)

## 2014-10-04 LAB — COMPREHENSIVE METABOLIC PANEL
ALBUMIN: 2.7 g/dL — AB (ref 3.5–5.2)
ALK PHOS: 184 U/L — AB (ref 39–117)
ALT: 58 U/L — AB (ref 0–35)
AST: 45 U/L — ABNORMAL HIGH (ref 0–37)
Anion gap: 11 (ref 5–15)
BUN: 13 mg/dL (ref 6–23)
CO2: 21 mmol/L (ref 19–32)
CREATININE: 0.84 mg/dL (ref 0.50–1.10)
Calcium: 8.8 mg/dL (ref 8.4–10.5)
Chloride: 103 mmol/L (ref 96–112)
GFR calc non Af Amer: 72 mL/min — ABNORMAL LOW (ref >90)
GFR, EST AFRICAN AMERICAN: 83 mL/min — AB (ref >90)
Glucose, Bld: 99 mg/dL (ref 70–99)
Potassium: 2.8 mmol/L — ABNORMAL LOW (ref 3.5–5.1)
Sodium: 135 mmol/L (ref 135–145)
Total Bilirubin: 0.7 mg/dL (ref 0.3–1.2)
Total Protein: 5.8 g/dL — ABNORMAL LOW (ref 6.0–8.3)

## 2014-10-04 LAB — I-STAT TROPONIN, ED
Troponin i, poc: 0 ng/mL (ref 0.00–0.08)
Troponin i, poc: 0 ng/mL (ref 0.00–0.08)

## 2014-10-04 LAB — LIPASE, BLOOD: LIPASE: 121 U/L — AB (ref 11–59)

## 2014-10-04 LAB — I-STAT CG4 LACTIC ACID, ED
LACTIC ACID, VENOUS: 1.04 mmol/L (ref 0.5–2.0)
LACTIC ACID, VENOUS: 0.68 mmol/L (ref 0.5–2.0)

## 2014-10-04 MED ORDER — SODIUM CHLORIDE 0.9 % IV SOLN
Freq: Once | INTRAVENOUS | Status: AC
  Administered 2014-10-04: 20:00:00 via INTRAVENOUS

## 2014-10-04 MED ORDER — ASPIRIN 81 MG PO CHEW
81.0000 mg | CHEWABLE_TABLET | Freq: Once | ORAL | Status: AC
  Administered 2014-10-04: 81 mg via ORAL

## 2014-10-04 MED ORDER — SODIUM CHLORIDE 0.9 % IV BOLUS (SEPSIS)
1000.0000 mL | Freq: Once | INTRAVENOUS | Status: AC
  Administered 2014-10-04: 1000 mL via INTRAVENOUS

## 2014-10-04 MED ORDER — IOHEXOL 300 MG/ML  SOLN
100.0000 mL | Freq: Once | INTRAMUSCULAR | Status: AC | PRN
  Administered 2014-10-04: 100 mL via INTRAVENOUS

## 2014-10-04 MED ORDER — MORPHINE SULFATE 4 MG/ML IJ SOLN
4.0000 mg | Freq: Once | INTRAMUSCULAR | Status: DC
  Filled 2014-10-04: qty 1

## 2014-10-04 MED ORDER — POTASSIUM CHLORIDE 10 MEQ/100ML IV SOLN
10.0000 meq | INTRAVENOUS | Status: AC
  Administered 2014-10-04: 10 meq via INTRAVENOUS
  Filled 2014-10-04: qty 100

## 2014-10-04 MED ORDER — IOHEXOL 300 MG/ML  SOLN
25.0000 mL | Freq: Once | INTRAMUSCULAR | Status: AC | PRN
  Administered 2014-10-04: 25 mL via ORAL

## 2014-10-04 MED ORDER — ASPIRIN 81 MG PO CHEW
81.0000 mg | CHEWABLE_TABLET | Freq: Three times a day (TID) | ORAL | Status: DC
  Administered 2014-10-04: 81 mg via ORAL

## 2014-10-04 MED ORDER — POTASSIUM CHLORIDE CRYS ER 20 MEQ PO TBCR: 20.0000 meq | EXTENDED_RELEASE_TABLET | Freq: Two times a day (BID) | ORAL | Status: DC

## 2014-10-04 MED ORDER — ONDANSETRON HCL 4 MG/2ML IJ SOLN
4.0000 mg | Freq: Once | INTRAMUSCULAR | Status: AC
  Administered 2014-10-04: 4 mg via INTRAVENOUS
  Filled 2014-10-04: qty 2

## 2014-10-04 NOTE — ED Notes (Signed)
MD at bedside. 
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
## 2014-10-04 NOTE — ED Notes (Signed)
RN drew labs, hemolyzed in syringe.  Plhebotomy to attempt.  PA aware.
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
## 2014-10-04 NOTE — Discharge Instructions (Signed)
Start taking pepcid daily sold over the counter. Potassium for the next 3 days. Drink plenty of fluids. Rest. Follow up with your doctor in the next several days. Return if worsening symptoms.     Dehydration, Adult Dehydration is when you lose more fluids from the body than you take in. Vital organs like the kidneys, brain, and heart cannot function without a proper amount of fluids and salt. Any loss of fluids from the body can cause dehydration.  CAUSES   Vomiting.  Diarrhea.  Excessive sweating.  Excessive urine output.  Fever. SYMPTOMS  Mild dehydration  Thirst.  Dry lips.  Slightly dry mouth. Moderate dehydration  Very dry mouth.  Sunken eyes.  Skin does not bounce back quickly when lightly pinched and released.  Dark urine and decreased urine production.  Decreased tear production.  Headache. Severe dehydration  Very dry mouth.  Extreme thirst.  Rapid, weak pulse (more than 100 beats per minute at rest).  Cold hands and feet.  Not able to sweat in spite of heat and temperature.  Rapid breathing.  Blue lips.  Confusion and lethargy.  Difficulty being awakened.  Minimal urine production.  No tears. DIAGNOSIS  Your caregiver will diagnose dehydration based on your symptoms and your exam. Blood and urine tests will help confirm the diagnosis. The diagnostic evaluation should also identify the cause of dehydration. TREATMENT  Treatment of mild or moderate dehydration can often be done at home by increasing the amount of fluids that you drink. It is best to drink small amounts of fluid more often. Drinking too much at one time can make vomiting worse. Refer to the home care instructions below. Severe dehydration needs to be treated at the hospital where you will probably be given intravenous (IV) fluids that contain water and electrolytes. HOME CARE INSTRUCTIONS   Ask your caregiver about specific rehydration instructions.  Drink enough fluids to  keep your urine clear or pale yellow.  Drink small amounts frequently if you have nausea and vomiting.  Eat as you normally do.  Avoid:  Foods or drinks high in sugar.  Carbonated drinks.  Juice.  Extremely hot or cold fluids.  Drinks with caffeine.  Fatty, greasy foods.  Alcohol.  Tobacco.  Overeating.  Gelatin desserts.  Wash your hands well to avoid spreading bacteria and viruses.  Only take over-the-counter or prescription medicines for pain, discomfort, or fever as directed by your caregiver.  Ask your caregiver if you should continue all prescribed and over-the-counter medicines.  Keep all follow-up appointments with your caregiver. SEEK MEDICAL CARE IF:  You have abdominal pain and it increases or stays in one area (localizes).  You have a rash, stiff neck, or severe headache.  You are irritable, sleepy, or difficult to awaken.  You are weak, dizzy, or extremely thirsty. SEEK IMMEDIATE MEDICAL CARE IF:   You are unable to keep fluids down or you get worse despite treatment.  You have frequent episodes of vomiting or diarrhea.  You have blood or green matter (bile) in your vomit.  You have blood in your stool or your stool looks black and tarry.  You have not urinated in 6 to 8 hours, or you have only urinated a small amount of very dark urine.  You have a fever.  You faint. MAKE SURE YOU:   Understand these instructions.  Will watch your condition.  Will get help right away if you are not doing well or get worse. Document Released: 05/28/2005 Document Revised: 08/20/2011  Document Reviewed: 01/15/2011 [facility] Patient Information 2015 [place]. This information is not intended to replace advice given to you by your health care provider. Make sure you discuss any questions you have with your health care provider.  Potassium Content of Foods Potassium is a mineral found in many foods and drinks. It helps keep fluids and minerals  balanced in your body and affects how steadily your heart beats. Potassium also helps control your blood pressure and keep your muscles and nervous system healthy. Certain health conditions and medicines may change the balance of potassium in your body. When this happens, you can help balance your level of potassium through the foods that you do or do not eat. Your health care provider or dietitian may recommend an amount of potassium that you should have each day. The following lists of foods provide the amount of potassium (in parentheses) per serving in each item. HIGH IN POTASSIUM  The following foods and beverages have 200 mg or more of potassium per serving:  Apricots, 2 raw or 5 dry (200 mg).  Artichoke, 1 medium (345 mg).  Avocado, raw,  each (245 mg).  Banana, 1 medium (425 mg).  Beans, lima, or baked beans, canned,  cup (280 mg).  Beans, white, canned,  cup (595 mg).  Beef roast, 3 oz (320 mg).  Beef, ground, 3 oz (270 mg).  Beets, raw or cooked,  cup (260 mg).  Bran muffin, 2 oz (300 mg).  Broccoli,  cup (230 mg).  Brussels sprouts,  cup (250 mg).  Cantaloupe,  cup (215 mg).  Cereal, 100% bran,  cup (200-400 mg).  Cheeseburger, single, fast food, 1 each (225-400 mg).  Chicken, 3 oz (220 mg).  Clams, canned, 3 oz (535 mg).  Crab, 3 oz (225 mg).  Dates, 5 each (270 mg).  Dried beans and peas,  cup (300-475 mg).  Figs, dried, 2 each (260 mg).  Fish: halibut, tuna, cod, snapper, 3 oz (480 mg).  Fish: salmon, haddock, swordfish, perch, 3 oz (300 mg).  Fish, tuna, canned 3 oz (200 mg).  [place] fries, fast food, 3 oz (470 mg).  Granola with fruit and nuts,  cup (200 mg).  Grapefruit juice,  cup (200 mg).  Greens, beet,  cup (655 mg).  Honeydew melon,  cup (200 mg).  Kale, raw, 1 cup (300 mg).  Kiwi, 1 medium (240 mg).  Kohlrabi, rutabaga, parsnips,  cup (280 mg).  Lentils,  cup (365 mg).  Mango, 1 each (325 mg).  Milk,  chocolate, 1 cup (420 mg).  Milk: nonfat, low-fat, whole, buttermilk, 1 cup (350-380 mg).  Molasses, 1 Tbsp (295 mg).  Mushrooms,  cup (280) mg.  Nectarine, 1 each (275 mg).  Nuts: almonds, peanuts, hazelnuts, [place], cashew, mixed, 1 oz (200 mg).  Nuts, pistachios, 1 oz (295 mg).  Orange, 1 each (240 mg).  Orange juice,  cup (235 mg).  Papaya, medium,  fruit (390 mg).  Peanut butter, chunky, 2 Tbsp (240 mg).  Peanut butter, smooth, 2 Tbsp (210 mg).  Pear, 1 medium (200 mg).  Pomegranate, 1 whole (400 mg).  Pomegranate juice,  cup (215 mg).  Pork, 3 oz (350 mg).  Potato chips, salted, 1 oz (465 mg).  Potato, baked with skin, 1 medium (925 mg).  Potatoes, boiled,  cup (255 mg).  Potatoes, mashed,  cup (330 mg).  Prune juice,  cup (370 mg).  Prunes, 5 each (305 mg).  Pudding, chocolate,  cup (230 mg).  Pumpkin, canned,  cup (250 mg).  Raisins, seedless,  cup (270 mg).  Seeds, sunflower or pumpkin, 1 oz (240 mg).  Soy milk, 1 cup (300 mg).  Spinach,  cup (420 mg).  Spinach, canned,  cup (370 mg).  Sweet potato, baked with skin, 1 medium (450 mg).  Swiss chard,  cup (480 mg).  Tomato or vegetable juice,  cup (275 mg).  Tomato sauce or puree,  cup (400-550 mg).  Tomato, raw, 1 medium (290 mg).  Tomatoes, canned,  cup (200-300 mg).  [place], 3 oz (250 mg).  Wheat germ, 1 oz (250 mg).  Winter squash,  cup (250 mg).  Yogurt, plain or fruited, 6 oz (260-435 mg).  Zucchini,  cup (220 mg). MODERATE IN POTASSIUM The following foods and beverages have 50-200 mg of potassium per serving:  Apple, 1 each (150 mg).  Apple juice,  cup (150 mg).  Applesauce,  cup (90 mg).  Apricot nectar,  cup (140 mg).  Asparagus, small spears,  cup or 6 spears (155 mg).  Bagel, cinnamon raisin, 1 each (130 mg).  Bagel, egg or plain, 4 in., 1 each (70 mg).  Beans, green,  cup (90 mg).  Beans, yellow,  cup (190 mg).  Beer,  regular, 12 oz (100 mg).  Beets, canned,  cup (125 mg).  Blackberries,  cup (115 mg).  Blueberries,  cup (60 mg).  Bread, whole wheat, 1 slice (70 mg).  Broccoli, raw,  cup (145 mg).  Cabbage,  cup (150 mg).  Carrots, cooked or raw,  cup (180 mg).  Cauliflower, raw,  cup (150 mg).  Celery, raw,  cup (155 mg).  Cereal, bran flakes, cup (120-150 mg).  Cheese, cottage,  cup (110 mg).  Cherries, 10 each (150 mg).  Chocolate, 1 oz bar (165 mg).  Coffee, brewed 6 oz (90 mg).  Corn,  cup or 1 ear (195 mg).  Cucumbers,  cup (80 mg).  Egg, large, 1 each (60 mg).  Eggplant,  cup (60 mg).  Endive, raw, cup (80 mg).  English muffin, 1 each (65 mg).  Fish, orange roughy, 3 oz (150 mg).  Frankfurter, beef or pork, 1 each (75 mg).  Fruit cocktail,  cup (115 mg).  Grape juice,  cup (170 mg).  Grapefruit,  fruit (175 mg).  Grapes,  cup (155 mg).  Greens: kale, turnip, collard,  cup (110-150 mg).  Ice cream or frozen yogurt, chocolate,  cup (175 mg).  Ice cream or frozen yogurt, vanilla,  cup (120-150 mg).  Lemons, limes, 1 each (80 mg).  Lettuce, all types, 1 cup (100 mg).  Mixed vegetables,  cup (150 mg).  Mushrooms, raw,  cup (110 mg).  Nuts: walnuts, pecans, or macadamia, 1 oz (125 mg).  Oatmeal,  cup (80 mg).  Okra,  cup (110 mg).  Onions, raw,  cup (120 mg).  Peach, 1 each (185 mg).  Peaches, canned,  cup (120 mg).  Pears, canned,  cup (120 mg).  Peas, green, frozen,  cup (90 mg).  Peppers, green,  cup (130 mg).  Peppers, red,  cup (160 mg).  Pineapple juice,  cup (165 mg).  Pineapple, fresh or canned,  cup (100 mg).  Plums, 1 each (105 mg).  Pudding, vanilla,  cup (150 mg).  Raspberries,  cup (90 mg).  Rhubarb,  cup (115 mg).  Rice, wild,  cup (80 mg).  Shrimp, 3 oz (155 mg).  Spinach, raw, 1 cup (170 mg).  Strawberries,  cup (125 mg).  Summer squash  cup (  175-200 mg).  Swiss  chard, raw, 1 cup (135 mg).  Tangerines, 1 each (140 mg).  Tea, brewed, 6 oz (65 mg).  Turnips,  cup (140 mg).  Watermelon,  cup (85 mg).  Wine, red, table, 5 oz (180 mg).  Wine, white, table, 5 oz (100 mg). LOW IN POTASSIUM The following foods and beverages have less than 50 mg of potassium per serving.  Bread, white, 1 slice (30 mg).  Carbonated beverages, 12 oz (less than 5 mg).  Cheese, 1 oz (20-30 mg).  Cranberries,  cup (45 mg).  Cranberry juice cocktail,  cup (20 mg).  Fats and oils, 1 Tbsp (less than 5 mg).  Hummus, 1 Tbsp (32 mg).  Nectar: papaya, mango, or pear,  cup (35 mg).  Rice, white or brown,  cup (50 mg).  Spaghetti or macaroni,  cup cooked (30 mg).  Tortilla, flour or corn, 1 each (50 mg).  Waffle, 4 in., 1 each (50 mg).  Water chestnuts,  cup (40 mg). Document Released: 01/09/2005 Document Revised: 06/02/2013 Document Reviewed: 04/24/2013 [facility] Patient Information 2015 [place], [place]. This information is not intended to replace advice given to you by your health care provider. Make sure you discuss any questions you have with your health care provider.

## 2014-10-04 NOTE — ED Notes (Signed)
Pt states IV is burning her hand. Iv was decreased to 61ml/hr will titrate up as pt tolerates

## 2014-10-04 NOTE — ED Notes (Signed)
Patient transported to Ultrasound 

## 2014-10-04 NOTE — ED Notes (Signed)
Pt sent from [facility] office after being seen for n/v and epigastric pain that started 5 days ago. Pt states the vomiting started 5 days ago and today just feels nauseous and has pain in upper abdomen. Pt was given 3 81mg  of aspirin by EMS.

## 2014-10-04 NOTE — ED Provider Notes (Signed)
  Face-to-face evaluation   History: She has been ill for about a week, with nausea, vomiting, abdominal pain, chest discomfort and a sensation of "flip-flop" in her lower chest. She saw her PCP today, was treated with aspirin, and referred here. It was noted that her EKG had changed from prior. She has been taking her usual medications. She is on atenolol and others. She describes a "sore" sensation of her upper abdomen, and chest.    Physical exam: Alert, cooperative. Heart regular rate and rhythm without murmur. Lungs clear. Abdomen normal bowel sounds, soft, distended, she has mild epigastric discomfort to light touch.  Medical screening examination/treatment/procedure(s) were conducted as a shared visit with non-physician practitioner(s) and myself.  I personally evaluated the patient during the encounter  Daleen Bo, MD 10/06/14 [phone]

## 2014-10-11 ENCOUNTER — Encounter: Payer: Self-pay | Admitting: Family Medicine

## 2014-10-11 ENCOUNTER — Ambulatory Visit (INDEPENDENT_AMBULATORY_CARE_PROVIDER_SITE_OTHER): Payer: 59 | Admitting: Family Medicine

## 2014-10-11 VITALS — BP 94/66 | HR 78 | Temp 99.2°F | Resp 20 | Ht 63.0 in | Wt 199.0 lb

## 2014-10-11 DIAGNOSIS — K2970 Gastritis, unspecified, without bleeding: Secondary | ICD-10-CM

## 2014-10-11 DIAGNOSIS — K219 Gastro-esophageal reflux disease without esophagitis: Secondary | ICD-10-CM

## 2014-10-11 DIAGNOSIS — R197 Diarrhea, unspecified: Secondary | ICD-10-CM

## 2014-10-11 DIAGNOSIS — R1110 Vomiting, unspecified: Secondary | ICD-10-CM | POA: Diagnosis not present

## 2014-10-11 LAB — CBC WITH DIFFERENTIAL/PLATELET
Basophils Absolute: 0.1 10*3/uL (ref 0.0–0.1)
Basophils Relative: 0.4 % (ref 0.0–3.0)
EOS ABS: 0.1 10*3/uL (ref 0.0–0.7)
Eosinophils Relative: 0.5 % (ref 0.0–5.0)
HCT: 35.4 % — ABNORMAL LOW (ref 36.0–46.0)
HEMOGLOBIN: 12.2 g/dL (ref 12.0–15.0)
LYMPHS PCT: 8.6 % — AB (ref 12.0–46.0)
Lymphs Abs: 1.2 10*3/uL (ref 0.7–4.0)
MCHC: 34.3 g/dL (ref 30.0–36.0)
MCV: 85.5 fl (ref 78.0–100.0)
Monocytes Absolute: 1.1 10*3/uL — ABNORMAL HIGH (ref 0.1–1.0)
Monocytes Relative: 8 % (ref 3.0–12.0)
Neutro Abs: 11.3 10*3/uL — ABNORMAL HIGH (ref 1.4–7.7)
Neutrophils Relative %: 82.5 % — ABNORMAL HIGH (ref 43.0–77.0)
Platelets: 237 10*3/uL (ref 150.0–400.0)
RBC: 4.15 Mil/uL (ref 3.87–5.11)
RDW: 14.3 % (ref 11.5–15.5)
WBC: 13.7 10*3/uL — ABNORMAL HIGH (ref 4.0–10.5)

## 2014-10-11 LAB — COMPREHENSIVE METABOLIC PANEL
ALT: 32 U/L (ref 0–35)
AST: 28 U/L (ref 0–37)
Albumin: 3 g/dL — ABNORMAL LOW (ref 3.5–5.2)
Alkaline Phosphatase: 180 U/L — ABNORMAL HIGH (ref 39–117)
BUN: 8 mg/dL (ref 6–23)
CO2: 23 mEq/L (ref 19–32)
CREATININE: 0.76 mg/dL (ref 0.40–1.20)
Calcium: 8.5 mg/dL (ref 8.4–10.5)
Chloride: 101 mEq/L (ref 96–112)
GFR: 81.43 mL/min (ref >60.00)
GLUCOSE: 111 mg/dL — AB (ref 70–99)
Potassium: 3.1 mEq/L — ABNORMAL LOW (ref 3.5–5.1)
Sodium: 134 mEq/L — ABNORMAL LOW (ref 135–145)
TOTAL PROTEIN: 6.2 g/dL (ref 6.0–8.3)
Total Bilirubin: 0.9 mg/dL (ref 0.2–1.2)

## 2014-10-11 LAB — H. PYLORI ANTIBODY, IGG: H Pylori IgG: NEGATIVE

## 2014-10-11 LAB — LIPASE: Lipase: 144 U/L — ABNORMAL HIGH (ref 11.0–59.0)

## 2014-10-11 LAB — TSH: TSH: 7.41 u[IU]/mL — ABNORMAL HIGH (ref 0.35–4.50)

## 2014-10-11 MED ORDER — PROMETHAZINE HCL 12.5 MG PO TABS: ORAL_TABLET | ORAL | Status: AC

## 2014-10-11 MED ORDER — OMEPRAZOLE 40 MG PO CPDR: DELAYED_RELEASE_CAPSULE | ORAL | Status: AC

## 2014-10-11 MED ORDER — PROMETHAZINE HCL 25 MG/ML IJ SOLN
25.0000 mg | Freq: Once | INTRAMUSCULAR | Status: AC
  Administered 2014-10-11: 25 mg via INTRAMUSCULAR

## 2014-10-11 MED ORDER — PROMETHAZINE HCL 12.5 MG RE SUPP: 12.5000 mg | Freq: Four times a day (QID) | RECTAL | Status: AC | PRN

## 2014-10-11 MED ORDER — POTASSIUM CHLORIDE CRYS ER 20 MEQ PO TBCR: 40.0000 meq | EXTENDED_RELEASE_TABLET | Freq: Two times a day (BID) | ORAL | Status: AC
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
## 2014-10-11 NOTE — Progress Notes (Signed)
Pre visit review using our clinic review tool, if applicable. No additional management support is needed unless otherwise documented below in the visit note. 

## 2014-10-14 ENCOUNTER — Other Ambulatory Visit: Payer: Self-pay | Admitting: Family Medicine

## 2014-10-14 ENCOUNTER — Encounter: Payer: Self-pay | Admitting: Family Medicine

## 2014-10-14 ENCOUNTER — Telehealth: Payer: Self-pay | Admitting: Internal Medicine

## 2014-10-14 ENCOUNTER — Ambulatory Visit (HOSPITAL_BASED_OUTPATIENT_CLINIC_OR_DEPARTMENT_OTHER)
Admission: RE | Admit: 2014-10-14 | Discharge: 2014-10-14 | Disposition: A | Discharge status: Home / Self Care | Payer: 59 | Source: Ambulatory Visit | Attending: Family Medicine | Admitting: Family Medicine

## 2014-10-14 ENCOUNTER — Ambulatory Visit (INDEPENDENT_AMBULATORY_CARE_PROVIDER_SITE_OTHER): Payer: 59 | Admitting: Family Medicine

## 2014-10-14 VITALS — BP 103/73 | HR 70 | Temp 96.9°F | Resp 16 | Wt 194.0 lb

## 2014-10-14 DIAGNOSIS — I330 Acute and subacute infective endocarditis: Secondary | ICD-10-CM

## 2014-10-14 DIAGNOSIS — R1110 Vomiting, unspecified: Secondary | ICD-10-CM

## 2014-10-14 DIAGNOSIS — K2990 Gastroduodenitis, unspecified, without bleeding: Principal | ICD-10-CM | POA: Diagnosis present

## 2014-10-14 DIAGNOSIS — F418 Other specified anxiety disorders: Secondary | ICD-10-CM | POA: Diagnosis present

## 2014-10-14 DIAGNOSIS — E669 Obesity, unspecified: Secondary | ICD-10-CM | POA: Diagnosis present

## 2014-10-14 DIAGNOSIS — Z87891 Personal history of nicotine dependence: Secondary | ICD-10-CM

## 2014-10-14 DIAGNOSIS — R1013 Epigastric pain: Secondary | ICD-10-CM | POA: Insufficient documentation

## 2014-10-14 DIAGNOSIS — Z91040 Latex allergy status: Secondary | ICD-10-CM

## 2014-10-14 DIAGNOSIS — Y831 Surgical operation with implant of artificial internal device as the cause of abnormal reaction of the patient, or of later complication, without mention of misadventure at the time of the procedure: Secondary | ICD-10-CM | POA: Diagnosis present

## 2014-10-14 DIAGNOSIS — R1010 Upper abdominal pain, unspecified: Secondary | ICD-10-CM

## 2014-10-14 DIAGNOSIS — N1330 Unspecified hydronephrosis: Secondary | ICD-10-CM | POA: Diagnosis present

## 2014-10-14 DIAGNOSIS — F329 Major depressive disorder, single episode, unspecified: Secondary | ICD-10-CM | POA: Diagnosis present

## 2014-10-14 DIAGNOSIS — B952 Enterococcus as the cause of diseases classified elsewhere: Secondary | ICD-10-CM | POA: Diagnosis present

## 2014-10-14 DIAGNOSIS — M797 Fibromyalgia: Secondary | ICD-10-CM | POA: Diagnosis present

## 2014-10-14 DIAGNOSIS — E778 Other disorders of glycoprotein metabolism: Secondary | ICD-10-CM

## 2014-10-14 DIAGNOSIS — K219 Gastro-esophageal reflux disease without esophagitis: Secondary | ICD-10-CM

## 2014-10-14 DIAGNOSIS — R61 Generalized hyperhidrosis: Secondary | ICD-10-CM | POA: Diagnosis present

## 2014-10-14 DIAGNOSIS — Z79899 Other long term (current) drug therapy: Secondary | ICD-10-CM

## 2014-10-14 DIAGNOSIS — R161 Splenomegaly, not elsewhere classified: Secondary | ICD-10-CM | POA: Diagnosis present

## 2014-10-14 DIAGNOSIS — E039 Hypothyroidism, unspecified: Secondary | ICD-10-CM | POA: Diagnosis present

## 2014-10-14 DIAGNOSIS — E860 Dehydration: Secondary | ICD-10-CM | POA: Diagnosis not present

## 2014-10-14 DIAGNOSIS — K2970 Gastritis, unspecified, without bleeding: Secondary | ICD-10-CM | POA: Diagnosis not present

## 2014-10-14 DIAGNOSIS — Z7982 Long term (current) use of aspirin: Secondary | ICD-10-CM

## 2014-10-14 DIAGNOSIS — Z953 Presence of xenogenic heart valve: Secondary | ICD-10-CM

## 2014-10-14 DIAGNOSIS — Z91018 Allergy to other foods: Secondary | ICD-10-CM

## 2014-10-14 DIAGNOSIS — R197 Diarrhea, unspecified: Secondary | ICD-10-CM

## 2014-10-14 DIAGNOSIS — I10 Essential (primary) hypertension: Secondary | ICD-10-CM | POA: Diagnosis present

## 2014-10-14 DIAGNOSIS — F419 Anxiety disorder, unspecified: Secondary | ICD-10-CM | POA: Diagnosis present

## 2014-10-14 DIAGNOSIS — I272 Other secondary pulmonary hypertension: Secondary | ICD-10-CM | POA: Diagnosis present

## 2014-10-14 DIAGNOSIS — T39395A Adverse effect of other nonsteroidal anti-inflammatory drugs [NSAID], initial encounter: Secondary | ICD-10-CM | POA: Diagnosis present

## 2014-10-14 DIAGNOSIS — K317 Polyp of stomach and duodenum: Secondary | ICD-10-CM | POA: Diagnosis present

## 2014-10-14 DIAGNOSIS — R112 Nausea with vomiting, unspecified: Secondary | ICD-10-CM

## 2014-10-14 DIAGNOSIS — Z791 Long term (current) use of non-steroidal anti-inflammatories (NSAID): Secondary | ICD-10-CM

## 2014-10-14 DIAGNOSIS — M1990 Unspecified osteoarthritis, unspecified site: Secondary | ICD-10-CM | POA: Diagnosis present

## 2014-10-14 DIAGNOSIS — Z6830 Body mass index (BMI) 30.0-30.9, adult: Secondary | ICD-10-CM

## 2014-10-14 DIAGNOSIS — R748 Abnormal levels of other serum enzymes: Secondary | ICD-10-CM

## 2014-10-14 DIAGNOSIS — E871 Hypo-osmolality and hyponatremia: Secondary | ICD-10-CM | POA: Diagnosis present

## 2014-10-14 DIAGNOSIS — E872 Acidosis: Secondary | ICD-10-CM | POA: Diagnosis present

## 2014-10-14 DIAGNOSIS — T826XXA Infection and inflammatory reaction due to cardiac valve prosthesis, initial encounter: Secondary | ICD-10-CM

## 2014-10-14 DIAGNOSIS — Z880 Allergy status to penicillin: Secondary | ICD-10-CM

## 2014-10-14 DIAGNOSIS — K760 Fatty (change of) liver, not elsewhere classified: Secondary | ICD-10-CM | POA: Diagnosis present

## 2014-10-14 DIAGNOSIS — Z951 Presence of aortocoronary bypass graft: Secondary | ICD-10-CM

## 2014-10-14 DIAGNOSIS — E785 Hyperlipidemia, unspecified: Secondary | ICD-10-CM | POA: Diagnosis present

## 2014-10-14 DIAGNOSIS — Z888 Allergy status to other drugs, medicaments and biological substances status: Secondary | ICD-10-CM

## 2014-10-14 DIAGNOSIS — K529 Noninfective gastroenteritis and colitis, unspecified: Secondary | ICD-10-CM | POA: Diagnosis present

## 2014-10-14 DIAGNOSIS — K580 Irritable bowel syndrome with diarrhea: Secondary | ICD-10-CM | POA: Diagnosis present

## 2014-10-14 DIAGNOSIS — I059 Rheumatic mitral valve disease, unspecified: Secondary | ICD-10-CM | POA: Diagnosis present

## 2014-10-14 DIAGNOSIS — E876 Hypokalemia: Secondary | ICD-10-CM | POA: Diagnosis present

## 2014-10-14 DIAGNOSIS — Z90710 Acquired absence of both cervix and uterus: Secondary | ICD-10-CM

## 2014-10-14 DIAGNOSIS — D62 Acute posthemorrhagic anemia: Secondary | ICD-10-CM | POA: Diagnosis not present

## 2014-10-14 LAB — POCT URINALYSIS DIPSTICK
GLUCOSE UA: NEGATIVE
Leukocytes, UA: NEGATIVE
Nitrite, UA: NEGATIVE
Protein, UA: 100
Spec Grav, UA: 1.02
Urobilinogen, UA: 0.2
pH, UA: 6

## 2014-10-14 MED ORDER — HYOSCYAMINE SULFATE 0.125 MG SL SUBL: SUBLINGUAL_TABLET | SUBLINGUAL | Status: AC

## 2014-10-14 MED ORDER — LEVOTHYROXINE SODIUM 125 MCG PO TABS: 125.0000 ug | ORAL_TABLET | Freq: Every day | ORAL | Status: AC

## 2014-10-14 NOTE — Telephone Encounter (Signed)
Can be offered an appt with APP.  No earlier openings with Carlean Purl at this point
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
## 2014-10-14 NOTE — Telephone Encounter (Signed)
Caller name: Diane Relation to pt: Referral Coordinator for Dr. Anitra Lauth at [facility] Call back number: call patient  [phone]   Reason for call:  Per Dr. Anitra Lauth, he wants this patient seen ASAP by Dr. Carlean Purl.  He states his office notes from 10/11/2014 have all the details for Dr. Carlean Purl. Please advise on scheduling.

## 2014-10-14 NOTE — Telephone Encounter (Signed)
Left message for call back to schedule.

## 2014-10-14 NOTE — Progress Notes (Signed)
Pre visit review using our clinic review tool, if applicable. No additional management support is needed unless otherwise documented below in the visit note. 

## 2014-10-14 NOTE — Patient Instructions (Signed)
Hold atorvastatin, atenolol, aspirin, and ibuprofen, and vitamins until further notice.

## 2014-10-15 ENCOUNTER — Encounter (HOSPITAL_COMMUNITY): Payer: Self-pay | Admitting: Emergency Medicine

## 2014-10-15 ENCOUNTER — Telehealth: Payer: Self-pay | Admitting: Family Medicine

## 2014-10-15 ENCOUNTER — Inpatient Hospital Stay (HOSPITAL_COMMUNITY)
Admission: EM | Admit: 2014-10-15 | Discharge: 2014-11-10 | DRG: 391 | Disposition: E | Payer: 59 | Attending: Internal Medicine | Admitting: Internal Medicine

## 2014-10-15 DIAGNOSIS — E872 Acidosis, unspecified: Secondary | ICD-10-CM

## 2014-10-15 DIAGNOSIS — E669 Obesity, unspecified: Secondary | ICD-10-CM | POA: Diagnosis present

## 2014-10-15 DIAGNOSIS — Z91018 Allergy to other foods: Secondary | ICD-10-CM | POA: Diagnosis not present

## 2014-10-15 DIAGNOSIS — Z91040 Latex allergy status: Secondary | ICD-10-CM | POA: Diagnosis not present

## 2014-10-15 DIAGNOSIS — E871 Hypo-osmolality and hyponatremia: Secondary | ICD-10-CM | POA: Diagnosis present

## 2014-10-15 DIAGNOSIS — Z791 Long term (current) use of non-steroidal anti-inflammatories (NSAID): Secondary | ICD-10-CM | POA: Diagnosis not present

## 2014-10-15 DIAGNOSIS — Y838 Other surgical procedures as the cause of abnormal reaction of the patient, or of later complication, without mention of misadventure at the time of the procedure: Secondary | ICD-10-CM | POA: Diagnosis not present

## 2014-10-15 DIAGNOSIS — T826XXA Infection and inflammatory reaction due to cardiac valve prosthesis, initial encounter: Secondary | ICD-10-CM | POA: Insufficient documentation

## 2014-10-15 DIAGNOSIS — K2970 Gastritis, unspecified, without bleeding: Secondary | ICD-10-CM | POA: Insufficient documentation

## 2014-10-15 DIAGNOSIS — M797 Fibromyalgia: Secondary | ICD-10-CM | POA: Diagnosis present

## 2014-10-15 DIAGNOSIS — K760 Fatty (change of) liver, not elsewhere classified: Secondary | ICD-10-CM | POA: Diagnosis present

## 2014-10-15 DIAGNOSIS — F329 Major depressive disorder, single episode, unspecified: Secondary | ICD-10-CM | POA: Diagnosis present

## 2014-10-15 DIAGNOSIS — B952 Enterococcus as the cause of diseases classified elsewhere: Secondary | ICD-10-CM | POA: Diagnosis present

## 2014-10-15 DIAGNOSIS — Y831 Surgical operation with implant of artificial internal device as the cause of abnormal reaction of the patient, or of later complication, without mention of misadventure at the time of the procedure: Secondary | ICD-10-CM | POA: Diagnosis present

## 2014-10-15 DIAGNOSIS — D62 Acute posthemorrhagic anemia: Secondary | ICD-10-CM | POA: Insufficient documentation

## 2014-10-15 DIAGNOSIS — R197 Diarrhea, unspecified: Secondary | ICD-10-CM | POA: Diagnosis not present

## 2014-10-15 DIAGNOSIS — Z7982 Long term (current) use of aspirin: Secondary | ICD-10-CM | POA: Diagnosis not present

## 2014-10-15 DIAGNOSIS — R7881 Bacteremia: Secondary | ICD-10-CM | POA: Insufficient documentation

## 2014-10-15 DIAGNOSIS — R609 Edema, unspecified: Secondary | ICD-10-CM | POA: Diagnosis not present

## 2014-10-15 DIAGNOSIS — I10 Essential (primary) hypertension: Secondary | ICD-10-CM | POA: Diagnosis present

## 2014-10-15 DIAGNOSIS — I340 Nonrheumatic mitral (valve) insufficiency: Secondary | ICD-10-CM | POA: Diagnosis not present

## 2014-10-15 DIAGNOSIS — E860 Dehydration: Secondary | ICD-10-CM | POA: Diagnosis present

## 2014-10-15 DIAGNOSIS — I959 Hypotension, unspecified: Secondary | ICD-10-CM | POA: Diagnosis not present

## 2014-10-15 DIAGNOSIS — Z79899 Other long term (current) drug therapy: Secondary | ICD-10-CM | POA: Diagnosis not present

## 2014-10-15 DIAGNOSIS — E039 Hypothyroidism, unspecified: Secondary | ICD-10-CM | POA: Diagnosis present

## 2014-10-15 DIAGNOSIS — R61 Generalized hyperhidrosis: Secondary | ICD-10-CM | POA: Diagnosis present

## 2014-10-15 DIAGNOSIS — Z880 Allergy status to penicillin: Secondary | ICD-10-CM | POA: Diagnosis not present

## 2014-10-15 DIAGNOSIS — R161 Splenomegaly, not elsewhere classified: Secondary | ICD-10-CM | POA: Diagnosis present

## 2014-10-15 DIAGNOSIS — Z90710 Acquired absence of both cervix and uterus: Secondary | ICD-10-CM | POA: Diagnosis not present

## 2014-10-15 DIAGNOSIS — N1330 Unspecified hydronephrosis: Secondary | ICD-10-CM | POA: Diagnosis present

## 2014-10-15 DIAGNOSIS — K580 Irritable bowel syndrome with diarrhea: Secondary | ICD-10-CM | POA: Diagnosis present

## 2014-10-15 DIAGNOSIS — F418 Other specified anxiety disorders: Secondary | ICD-10-CM | POA: Diagnosis present

## 2014-10-15 DIAGNOSIS — K2990 Gastroduodenitis, unspecified, without bleeding: Secondary | ICD-10-CM

## 2014-10-15 DIAGNOSIS — R112 Nausea with vomiting, unspecified: Secondary | ICD-10-CM

## 2014-10-15 DIAGNOSIS — E876 Hypokalemia: Secondary | ICD-10-CM

## 2014-10-15 DIAGNOSIS — Z951 Presence of aortocoronary bypass graft: Secondary | ICD-10-CM | POA: Diagnosis not present

## 2014-10-15 DIAGNOSIS — Z888 Allergy status to other drugs, medicaments and biological substances status: Secondary | ICD-10-CM | POA: Diagnosis not present

## 2014-10-15 DIAGNOSIS — I059 Rheumatic mitral valve disease, unspecified: Secondary | ICD-10-CM | POA: Diagnosis present

## 2014-10-15 DIAGNOSIS — K219 Gastro-esophageal reflux disease without esophagitis: Secondary | ICD-10-CM | POA: Diagnosis present

## 2014-10-15 DIAGNOSIS — I38 Endocarditis, valve unspecified: Secondary | ICD-10-CM

## 2014-10-15 DIAGNOSIS — Z953 Presence of xenogenic heart valve: Secondary | ICD-10-CM | POA: Diagnosis not present

## 2014-10-15 DIAGNOSIS — Z87891 Personal history of nicotine dependence: Secondary | ICD-10-CM | POA: Diagnosis not present

## 2014-10-15 DIAGNOSIS — R509 Fever, unspecified: Secondary | ICD-10-CM

## 2014-10-15 DIAGNOSIS — I330 Acute and subacute infective endocarditis: Secondary | ICD-10-CM | POA: Diagnosis not present

## 2014-10-15 DIAGNOSIS — K317 Polyp of stomach and duodenum: Secondary | ICD-10-CM | POA: Diagnosis present

## 2014-10-15 DIAGNOSIS — E785 Hyperlipidemia, unspecified: Secondary | ICD-10-CM | POA: Diagnosis present

## 2014-10-15 DIAGNOSIS — F419 Anxiety disorder, unspecified: Secondary | ICD-10-CM | POA: Diagnosis present

## 2014-10-15 DIAGNOSIS — M1990 Unspecified osteoarthritis, unspecified site: Secondary | ICD-10-CM | POA: Diagnosis present

## 2014-10-15 DIAGNOSIS — K589 Irritable bowel syndrome without diarrhea: Secondary | ICD-10-CM | POA: Diagnosis not present

## 2014-10-15 DIAGNOSIS — T39395A Adverse effect of other nonsteroidal anti-inflammatory drugs [NSAID], initial encounter: Secondary | ICD-10-CM | POA: Diagnosis present

## 2014-10-15 DIAGNOSIS — Z954 Presence of other heart-valve replacement: Secondary | ICD-10-CM | POA: Diagnosis not present

## 2014-10-15 DIAGNOSIS — Z6830 Body mass index (BMI) 30.0-30.9, adult: Secondary | ICD-10-CM | POA: Diagnosis not present

## 2014-10-15 DIAGNOSIS — K529 Noninfective gastroenteritis and colitis, unspecified: Secondary | ICD-10-CM | POA: Diagnosis present

## 2014-10-15 DIAGNOSIS — I272 Other secondary pulmonary hypertension: Secondary | ICD-10-CM | POA: Diagnosis present

## 2014-10-15 HISTORY — DX: Other specified anxiety disorders: F41.8

## 2014-10-15 LAB — COMPREHENSIVE METABOLIC PANEL
ALBUMIN: 3.1 g/dL — AB (ref 3.5–5.2)
ALBUMIN: 2.7 g/dL — AB (ref 3.5–5.0)
ALT: 50 U/L — AB (ref 0–35)
ALT: 53 U/L (ref 14–54)
AST: 54 U/L — AB (ref 0–37)
AST: 51 U/L — AB (ref 15–41)
Alkaline Phosphatase: 248 U/L — ABNORMAL HIGH (ref 39–117)
Alkaline Phosphatase: 223 U/L — ABNORMAL HIGH (ref 38–126)
Anion gap: 14 (ref 5–15)
BUN: 7 mg/dL (ref 6–23)
BUN: 6 mg/dL (ref 6–20)
CALCIUM: 8.9 mg/dL (ref 8.9–10.3)
CHLORIDE: 99 meq/L (ref 96–112)
CHLORIDE: 100 mmol/L — AB (ref 101–111)
CO2: 27 mEq/L (ref 19–32)
CO2: 16 mmol/L — AB (ref 22–32)
CREATININE: 0.86 mg/dL (ref 0.44–1.00)
Calcium: 9 mg/dL (ref 8.4–10.5)
Creatinine, Ser: 0.9 mg/dL (ref 0.40–1.20)
GFR calc Af Amer: >60 mL/min (ref >60)
GFR: 66.99 mL/min (ref >60.00)
GLUCOSE: 121 mg/dL — AB (ref 70–99)
Glucose, Bld: 135 mg/dL — ABNORMAL HIGH (ref 70–99)
POTASSIUM: 3 meq/L — AB (ref 3.5–5.1)
Potassium: 3 mmol/L — ABNORMAL LOW (ref 3.5–5.1)
SODIUM: 133 meq/L — AB (ref 135–145)
SODIUM: 130 mmol/L — AB (ref 135–145)
TOTAL PROTEIN: 6.8 g/dL (ref 6.5–8.1)
Total Bilirubin: 1.1 mg/dL (ref 0.2–1.2)
Total Bilirubin: 1.7 mg/dL — ABNORMAL HIGH (ref 0.3–1.2)
Total Protein: 6.8 g/dL (ref 6.0–8.3)

## 2014-10-15 LAB — CBC WITH DIFFERENTIAL/PLATELET
BASOS ABS: 0.1 10*3/uL (ref 0.0–0.1)
BASOS ABS: 0 10*3/uL (ref 0.0–0.1)
Basophils Relative: 0.6 % (ref 0.0–3.0)
Basophils Relative: 0 % (ref 0–1)
EOS PCT: 0 % (ref 0–5)
Eosinophils Absolute: 0.1 10*3/uL (ref 0.0–0.7)
Eosinophils Absolute: 0 10*3/uL (ref 0.0–0.7)
Eosinophils Relative: 0.7 % (ref 0.0–5.0)
HCT: 36 % (ref 36.0–46.0)
HCT: 33.4 % — ABNORMAL LOW (ref 36.0–46.0)
HEMOGLOBIN: 12.1 g/dL (ref 12.0–15.0)
Hemoglobin: 11.5 g/dL — ABNORMAL LOW (ref 12.0–15.0)
LYMPHS PCT: 11.5 % — AB (ref 12.0–46.0)
Lymphocytes Relative: 10 % — ABNORMAL LOW (ref 12–46)
Lymphs Abs: 1.5 10*3/uL (ref 0.7–4.0)
Lymphs Abs: 1.3 10*3/uL (ref 0.7–4.0)
MCH: 29 pg (ref 26.0–34.0)
MCHC: 33.7 g/dL (ref 30.0–36.0)
MCHC: 34.4 g/dL (ref 30.0–36.0)
MCV: 86 fl (ref 78.0–100.0)
MCV: 84.1 fL (ref 78.0–100.0)
MONO ABS: 1.3 10*3/uL — AB (ref 0.1–1.0)
MONOS PCT: 6 % (ref 3.0–12.0)
Monocytes Absolute: 0.8 10*3/uL (ref 0.1–1.0)
Monocytes Relative: 10 % (ref 3–12)
NEUTROS PCT: 81.2 % — AB (ref 43.0–77.0)
Neutro Abs: 10.8 10*3/uL — ABNORMAL HIGH (ref 1.4–7.7)
Neutro Abs: 10.1 10*3/uL — ABNORMAL HIGH (ref 1.7–7.7)
Neutrophils Relative %: 80 % — ABNORMAL HIGH (ref 43–77)
PLATELETS: 266 10*3/uL (ref 150–400)
Platelets: 298 10*3/uL (ref 150.0–400.0)
RBC: 4.19 Mil/uL (ref 3.87–5.11)
RBC: 3.97 MIL/uL (ref 3.87–5.11)
RDW: 14.2 % (ref 11.5–15.5)
RDW: 13.6 % (ref 11.5–15.5)
WBC: 13.3 10*3/uL — ABNORMAL HIGH (ref 4.0–10.5)
WBC: 12.7 10*3/uL — ABNORMAL HIGH (ref 4.0–10.5)

## 2014-10-15 LAB — URINALYSIS, ROUTINE W REFLEX MICROSCOPIC
Glucose, UA: NEGATIVE mg/dL
Ketones, ur: >80 mg/dL — AB
Leukocytes, UA: NEGATIVE
NITRITE: NEGATIVE
PROTEIN: 30 mg/dL — AB
SPECIFIC GRAVITY, URINE: 1.017 (ref 1.005–1.030)
Urobilinogen, UA: 0.2 mg/dL (ref 0.0–1.0)
pH: 6 (ref 5.0–8.0)

## 2014-10-15 LAB — URINE MICROSCOPIC-ADD ON

## 2014-10-15 LAB — GIARDIA/CRYPTOSPORIDIUM (EIA)
Cryptosporidium Screen (EIA): NEGATIVE
Giardia Screen (EIA): NEGATIVE

## 2014-10-15 LAB — LIPASE: Lipase: 158 U/L — ABNORMAL HIGH (ref 11.0–59.0)

## 2014-10-15 LAB — CLOSTRIDIUM DIFFICILE BY PCR: Toxigenic C. Difficile by PCR: NOT DETECTED

## 2014-10-15 LAB — LIPASE, BLOOD: Lipase: 62 U/L — ABNORMAL HIGH (ref 22–51)

## 2014-10-15 LAB — FECAL LACTOFERRIN, QUANT: LACTOFERRIN: NEGATIVE

## 2014-10-15 LAB — C-REACTIVE PROTEIN: CRP: 18.2 mg/dL (ref 0.5–20.0)

## 2014-10-15 LAB — SEDIMENTATION RATE: Sed Rate: 63 mm/hr — ABNORMAL HIGH (ref 0–22)

## 2014-10-15 LAB — ROTAVIRUS ANTIGEN, STOOL: Rotavirus: NOT DETECTED

## 2014-10-15 LAB — AMYLASE: AMYLASE: 96 U/L (ref 27–131)

## 2014-10-15 LAB — MAGNESIUM: MAGNESIUM: 1.5 mg/dL — AB (ref 1.7–2.4)

## 2014-10-15 MED ORDER — POTASSIUM CHLORIDE 10 MEQ/100ML IV SOLN
10.0000 meq | Freq: Once | INTRAVENOUS | Status: AC
  Administered 2014-10-15: 10 meq via INTRAVENOUS
  Filled 2014-10-15: qty 100

## 2014-10-15 MED ORDER — SODIUM CHLORIDE 0.9 % IV SOLN
INTRAVENOUS | Status: DC
  Administered 2014-10-16: 03:00:00 via INTRAVENOUS

## 2014-10-15 MED ORDER — ATENOLOL 50 MG PO TABS
25.0000 mg | ORAL_TABLET | Freq: Every day | ORAL | Status: DC
  Administered 2014-10-16 – 2014-10-19 (×3): 25 mg via ORAL
  Filled 2014-10-15 (×4): qty 1

## 2014-10-15 MED ORDER — ONDANSETRON HCL 4 MG/2ML IJ SOLN: 4.0000 mg | Freq: Three times a day (TID) | INTRAMUSCULAR | Status: DC | PRN

## 2014-10-15 MED ORDER — ONDANSETRON 4 MG PO TBDP
8.0000 mg | ORAL_TABLET | Freq: Once | ORAL | Status: AC
  Administered 2014-10-15: 8 mg via ORAL

## 2014-10-15 MED ORDER — SODIUM CHLORIDE 0.9 % IV SOLN: INTRAVENOUS | Status: DC

## 2014-10-15 MED ORDER — PROMETHAZINE HCL 25 MG/ML IJ SOLN
12.5000 mg | Freq: Four times a day (QID) | INTRAMUSCULAR | Status: DC | PRN
  Administered 2014-10-16: 12.5 mg via INTRAVENOUS
  Filled 2014-10-15: qty 1

## 2014-10-15 MED ORDER — PANTOPRAZOLE SODIUM 40 MG PO TBEC: 40.0000 mg | DELAYED_RELEASE_TABLET | Freq: Every day | ORAL | Status: DC

## 2014-10-15 MED ORDER — ONDANSETRON 4 MG PO TBDP
ORAL_TABLET | ORAL | Status: AC
  Filled 2014-10-15: qty 2

## 2014-10-15 MED ORDER — ATORVASTATIN CALCIUM 20 MG PO TABS
20.0000 mg | ORAL_TABLET | Freq: Every evening | ORAL | Status: DC
  Administered 2014-10-16 – 2014-10-22 (×6): 20 mg via ORAL
  Filled 2014-10-15 (×3): qty 1
  Filled 2014-10-15: qty 2
  Filled 2014-10-15 (×4): qty 1
  Filled 2014-10-15 (×4): qty 2
  Filled 2014-10-15: qty 1

## 2014-10-15 MED ORDER — LEVOTHYROXINE SODIUM 125 MCG PO TABS
125.0000 ug | ORAL_TABLET | Freq: Every day | ORAL | Status: DC
  Administered 2014-10-16 – 2014-10-19 (×4): 125 ug via ORAL
  Filled 2014-10-15 (×4): qty 1

## 2014-10-15 MED ORDER — ESCITALOPRAM OXALATE 10 MG PO TABS
20.0000 mg | ORAL_TABLET | Freq: Every day | ORAL | Status: DC
  Administered 2014-10-16 – 2014-10-22 (×7): 20 mg via ORAL
  Filled 2014-10-15 (×7): qty 2

## 2014-10-15 MED ORDER — ASPIRIN EC 81 MG PO TBEC
81.0000 mg | DELAYED_RELEASE_TABLET | Freq: Every day | ORAL | Status: DC
  Administered 2014-10-16 – 2014-10-17 (×2): 81 mg via ORAL
  Filled 2014-10-15 (×2): qty 1

## 2014-10-15 MED ORDER — SODIUM CHLORIDE 0.9 % IV BOLUS (SEPSIS)
1000.0000 mL | Freq: Once | INTRAVENOUS | Status: AC
  Administered 2014-10-15: 1000 mL via INTRAVENOUS

## 2014-10-15 MED ORDER — METOCLOPRAMIDE HCL 5 MG/ML IJ SOLN
10.0000 mg | Freq: Once | INTRAMUSCULAR | Status: AC
  Administered 2014-10-15: 10 mg via INTRAVENOUS
  Filled 2014-10-15: qty 2
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
## 2014-10-15 NOTE — Telephone Encounter (Signed)
Pt. Called and needs a refill on Hyoscyamine 0.125mg .

## 2014-10-15 NOTE — ED Notes (Signed)
The patient says she has been sick for six weeks.  She has had diarrhea, nausea and vomiting.  The patient rates her pain 10/10.  The patient has been seen here and she just went to her doctor's office today.  She does not remember what she was given but she was diagnosed with gastritis.

## 2014-10-15 NOTE — Telephone Encounter (Signed)
Per Dr. Anitra Lauth okay to advised husband. Husband advised and voiced understanding.

## 2014-10-15 NOTE — Telephone Encounter (Signed)
I called pt to discuss lab results and see how she was feeling. Pt still with severe abdominal pain, no better in any way.  The slightest intake of fluids exacerbates her pain. Given this report, plus today's labs showing ongoing leukocytosis, elevated lipase, a return of elevated AST/ALT, increase in Alk phos, and abd x-rays yesterday showing ileus, I recommended the patient return to the emergency department for admission to hospital (continuing to worsen despite maximal outpatient therapy PLUS etiology of the illness still not clear).  Needs IVF, bowel rest, pain control, monitoring for worsening illness, repeat CT abd, and likely a GI consult. Stool studies results so far are all neg (bact clx pending).  Pt was very resistant to this plan but her husband said he was going to follow my recommendations and get her to the ER.

## 2014-10-15 NOTE — Telephone Encounter (Signed)
I rx'd this med for her YESTERDAY, so she should have PLENTY, plus I put a RF on it I think.

## 2014-10-16 DIAGNOSIS — E872 Acidosis: Secondary | ICD-10-CM

## 2014-10-16 DIAGNOSIS — E876 Hypokalemia: Secondary | ICD-10-CM

## 2014-10-16 DIAGNOSIS — R112 Nausea with vomiting, unspecified: Secondary | ICD-10-CM

## 2014-10-16 DIAGNOSIS — R197 Diarrhea, unspecified: Secondary | ICD-10-CM

## 2014-10-16 DIAGNOSIS — E871 Hypo-osmolality and hyponatremia: Secondary | ICD-10-CM

## 2014-10-16 LAB — CBC
HCT: 30.5 % — ABNORMAL LOW (ref 36.0–46.0)
HCT: 30.9 % — ABNORMAL LOW (ref 36.0–46.0)
HEMOGLOBIN: 10.4 g/dL — AB (ref 12.0–15.0)
Hemoglobin: 10.4 g/dL — ABNORMAL LOW (ref 12.0–15.0)
MCH: 28.9 pg (ref 26.0–34.0)
MCH: 29.1 pg (ref 26.0–34.0)
MCHC: 34.1 g/dL (ref 30.0–36.0)
MCHC: 33.7 g/dL (ref 30.0–36.0)
MCV: 84.7 fL (ref 78.0–100.0)
MCV: 86.6 fL (ref 78.0–100.0)
PLATELETS: 262 10*3/uL (ref 150–400)
PLATELETS: 238 10*3/uL (ref 150–400)
RBC: 3.6 MIL/uL — AB (ref 3.87–5.11)
RBC: 3.57 MIL/uL — AB (ref 3.87–5.11)
RDW: 13.8 % (ref 11.5–15.5)
RDW: 14 % (ref 11.5–15.5)
WBC: 12 10*3/uL — ABNORMAL HIGH (ref 4.0–10.5)
WBC: 13.1 10*3/uL — AB (ref 4.0–10.5)

## 2014-10-16 LAB — BASIC METABOLIC PANEL
ANION GAP: 10 (ref 5–15)
Anion gap: 11 (ref 5–15)
BUN: 6 mg/dL (ref 6–20)
BUN: <5 mg/dL — ABNORMAL LOW (ref 6–20)
CALCIUM: 8.3 mg/dL — AB (ref 8.9–10.3)
CO2: 21 mmol/L — ABNORMAL LOW (ref 22–32)
CO2: 20 mmol/L — AB (ref 22–32)
Calcium: 8.3 mg/dL — ABNORMAL LOW (ref 8.9–10.3)
Chloride: 101 mmol/L (ref 101–111)
Chloride: 102 mmol/L (ref 101–111)
Creatinine, Ser: 0.78 mg/dL (ref 0.44–1.00)
Creatinine, Ser: 0.78 mg/dL (ref 0.44–1.00)
GFR calc Af Amer: >60 mL/min (ref >60)
GFR calc Af Amer: >60 mL/min (ref >60)
GFR calc non Af Amer: >60 mL/min (ref >60)
GFR calc non Af Amer: >60 mL/min (ref >60)
Glucose, Bld: 110 mg/dL — ABNORMAL HIGH (ref 70–99)
Glucose, Bld: 110 mg/dL — ABNORMAL HIGH (ref 70–99)
Potassium: 2.9 mmol/L — ABNORMAL LOW (ref 3.5–5.1)
Potassium: 3.4 mmol/L — ABNORMAL LOW (ref 3.5–5.1)
SODIUM: 133 mmol/L — AB (ref 135–145)
Sodium: 132 mmol/L — ABNORMAL LOW (ref 135–145)

## 2014-10-16 LAB — NOROVIRUS GROUP 1 & 2 BY PCR, STOOL
NOROVIRUS 1 BY PCR: NEGATIVE
Norovirus 2  by PCR: NEGATIVE

## 2014-10-16 LAB — GASTRIN: GASTRIN: 38 pg/mL (ref <=100)

## 2014-10-16 MED ORDER — ONDANSETRON HCL 4 MG/2ML IJ SOLN: 4.0000 mg | Freq: Four times a day (QID) | INTRAMUSCULAR | Status: DC | PRN

## 2014-10-16 MED ORDER — FAMOTIDINE 20 MG PO TABS
10.0000 mg | ORAL_TABLET | Freq: Every day | ORAL | Status: DC
  Administered 2014-10-16 – 2014-10-19 (×4): 10 mg via ORAL
  Filled 2014-10-16 (×4): qty 1

## 2014-10-16 MED ORDER — PROMETHAZINE HCL 25 MG/ML IJ SOLN
12.5000 mg | Freq: Four times a day (QID) | INTRAMUSCULAR | Status: DC | PRN
  Administered 2014-10-17: 12.5 mg via INTRAVENOUS
  Filled 2014-10-16: qty 1

## 2014-10-16 MED ORDER — POTASSIUM CHLORIDE 10 MEQ/100ML IV SOLN
10.0000 meq | INTRAVENOUS | Status: AC
  Administered 2014-10-16 (×3): 10 meq via INTRAVENOUS
  Filled 2014-10-16 (×6): qty 100

## 2014-10-16 MED ORDER — ONDANSETRON HCL 4 MG/2ML IJ SOLN
4.0000 mg | Freq: Two times a day (BID) | INTRAMUSCULAR | Status: DC
  Administered 2014-10-16 – 2014-10-18 (×4): 4 mg via INTRAVENOUS
  Filled 2014-10-16 (×5): qty 2

## 2014-10-16 MED ORDER — PANTOPRAZOLE SODIUM 40 MG IV SOLR
40.0000 mg | INTRAVENOUS | Status: DC
  Filled 2014-10-16: qty 40

## 2014-10-16 MED ORDER — ZINC OXIDE 40 % EX OINT
TOPICAL_OINTMENT | Freq: Three times a day (TID) | CUTANEOUS | Status: DC
  Administered 2014-10-16 – 2014-10-21 (×3): via TOPICAL
  Administered 2014-10-22: 1 via TOPICAL
  Administered 2014-10-22: 23:00:00 via TOPICAL
  Administered 2014-10-22: 1 via TOPICAL
  Filled 2014-10-16: qty 114

## 2014-10-16 MED ORDER — HEPARIN SODIUM (PORCINE) 5000 UNIT/ML IJ SOLN
5000.0000 [IU] | Freq: Three times a day (TID) | INTRAMUSCULAR | Status: DC
  Administered 2014-10-16: 5000 [IU] via SUBCUTANEOUS
  Filled 2014-10-16: qty 1

## 2014-10-16 MED ORDER — ACETAMINOPHEN 325 MG PO TABS
650.0000 mg | ORAL_TABLET | Freq: Four times a day (QID) | ORAL | Status: DC | PRN
  Administered 2014-10-18 – 2014-10-21 (×4): 650 mg via ORAL
  Filled 2014-10-16 (×5): qty 2

## 2014-10-16 MED ORDER — PANTOPRAZOLE SODIUM 40 MG IV SOLR
40.0000 mg | Freq: Two times a day (BID) | INTRAVENOUS | Status: DC
  Administered 2014-10-16 – 2014-10-19 (×7): 40 mg via INTRAVENOUS
  Filled 2014-10-16 (×8): qty 40

## 2014-10-16 MED ORDER — HYDROMORPHONE HCL 1 MG/ML IJ SOLN
0.2500 mg | INTRAMUSCULAR | Status: DC | PRN
  Administered 2014-10-19: 0.5 mg via INTRAVENOUS
  Filled 2014-10-16: qty 1

## 2014-10-16 MED ORDER — LOPERAMIDE HCL 2 MG PO CAPS
4.0000 mg | ORAL_CAPSULE | Freq: Two times a day (BID) | ORAL | Status: DC
  Administered 2014-10-16 – 2014-10-17 (×4): 4 mg via ORAL
  Filled 2014-10-16 (×6): qty 2

## 2014-10-16 MED ORDER — POTASSIUM CHLORIDE IN NACL 40-0.9 MEQ/L-% IV SOLN
INTRAVENOUS | Status: AC
  Administered 2014-10-16 (×2): 125 mL/h via INTRAVENOUS
  Filled 2014-10-16 (×3): qty 1000

## 2014-10-16 NOTE — Progress Notes (Addendum)
PROGRESS NOTE    Madeline Dyer PJK:[id number] DOB: 30-Nov-1950 DOA: 10/14/2014 PCP: Tammi Sou, MD  Primary Gastroenterologist: Dr. Silvano Rusk Primary Urologist: Dr. Risa Grill.  HPI/Brief narrative 64 year old female with history of GERD, PUD, IBS, status post cholecystectomy, fibromyalgia, HLD, hypothyroid, status post bioprosthetic aortic valve replacement, right hydronephrosis status post stent, fatty liver, chronic diarrhea, presented to [facility] ED on 10/14/13 with approximately 4 week history of epigastric abdominal pain, intermittent daily nonbloody emesis or retching, poor appetite, 5-6 pounds weight loss, ongoing chronic diarrhea but with streaks of blood and? Occasional black stools and generalized weakness. She attempted to see GI outpatient but no early appointments were available. Admitted for further evaluation and management. [facility] consulted.   Assessment/Plan:  NSAID induced gastritis versus PUD & GERD - Likely etiology of her nausea, vomiting, abdominal pain and decreased appetite. - GI consultation appreciated: Agree with and counseled regarding complete NSAIDs cessation permanently, increase PPI to twice a day, add H2 blocker at night, changed Zofran to scheduled at least temporarily and advance diet gradually. If does not tolerate diet in the next 24-48 hours then for EGD. - H pylori IgG: Negative - Urine microscopy not impressive for UTI.  Chronic diarrhea - As per GI, likely related to IBS - Started Imodium. Monitor - Rotavirus: Not detected - C. difficile PCR negative. - Giardia screen: Negative - Cryptosporidium screen: Negative  Dehydration with hyponatremia - Secondary to GI losses - Continue IV NS hydration and follow clinically  Hypokalemia - Replace aggressively and follow  Anemia - Monitor CBC closely. - Patient has FOBT + but not sure if she has true melena (rectal exam done by GI)  Hypothyroid - Continue levothyroxin - TSH: 7.41. May  be related to poor absorption in the context of GI issues. - Clinically euthyroid. - Follow TSH in 4-6 weeks.  Hyperlipidemia - Continue statins  Non-anion gap metabolic acidosis - Secondary to GI losses - Improved.    Code Status: Full Family Communication: None at bedside Disposition Plan: DC home when medically stable   Consultants:  [facility]  Procedures:  None  Antibiotics:  None   Subjective: Feels slightly better. Abdominal pain less. No nausea or vomiting.  Objective: Filed Vitals:   10/16/14 0045 10/16/14 0100 10/16/14 0144 10/16/14 0647  BP: 103/62 109/64 97/79 103/73  Pulse: 83 85 89 80  Temp:   98.6 F (37 C) 97.5 F (36.4 C)  TempSrc:   Oral Oral  Resp: 29 22    Weight:   88.8 kg (195 lb 12.3 oz)   SpO2: 95% 95% 96% 99%    Intake/Output Summary (Last 24 hours) at 10/16/14 1244 Last data filed at 10/16/14 0900  Gross per 24 hour  Intake    120 ml  Output      0 ml  Net    120 ml   Filed Weights   10/16/14 0144  Weight: 88.8 kg (195 lb 12.3 oz)     Exam:  General exam: Pleasant middle-aged female sitting up comfortably in bed. Oral mucosa dry. Respiratory system: Clear. No increased work of breathing. Cardiovascular system: S1 & S2 heard, RRR. No JVD, murmurs, gallops, clicks or pedal edema. Gastrointestinal system: Abdomen is nondistended, soft and nontender. Normal bowel sounds heard. Central nervous system: Alert and oriented. No focal neurological deficits. Extremities: Symmetric 5 x 5 power.   Data Reviewed: Basic Metabolic Panel:  Recent Labs Lab 10/11/14 1427 10/14/14 1622 10/26/2014 2105 11/04/2014 2233 10/16/14 0510  NA 134* 133*  130*  --  132*  K 3.1* 3.0* 3.0*  --  2.9*  CL 101 99 100*  --  101  CO2 23 27 16*  --  21*  GLUCOSE 111* 135* 121*  --  110*  BUN 8 7 6   --  6  CREATININE 0.76 0.90 0.86  --  0.78  CALCIUM 8.5 9.0 8.9  --  8.3*  MG  --   --   --  1.5*  --    Liver Function Tests:  Recent Labs Lab  10/11/14 1427 10/14/14 1622 10/18/2014 2105  AST 28 54* 51*  ALT 32 50* 53  ALKPHOS 180* 248* 223*  BILITOT 0.9 1.1 1.7*  PROT 6.2 6.8 6.8  ALBUMIN 3.0* 3.1* 2.7*    Recent Labs Lab 10/11/14 1427 10/14/14 1622 11/04/2014 2105  LIPASE 144.0* 158.0* 62*  AMYLASE  --  96  --    No results for input(s): AMMONIA in the last 168 hours. CBC:  Recent Labs Lab 10/11/14 1427 10/14/14 1622 11/01/2014 2105 10/16/14 0510  WBC 13.7* 13.3* 12.7* 12.0*  NEUTROABS 11.3* 10.8* 10.1*  --   HGB 12.2 12.1 11.5* 10.4*  HCT 35.4* 36.0 33.4* 30.5*  MCV 85.5 86.0 84.1 84.7  PLT 237.0 298.0 266 262   Cardiac Enzymes: No results for input(s): CKTOTAL, CKMB, CKMBINDEX, TROPONINI in the last 168 hours. BNP (last 3 results) No results for input(s): PROBNP in the last 8760 hours. CBG: No results for input(s): GLUCAP in the last 168 hours.  Recent Results (from the past 240 hour(s))  Giardia/cryptosporidium (EIA)     Status: None   Collection Time: 10/14/14  3:52 PM  Result Value Ref Range Status   Giardia Screen (EIA) NEGATIVE  Final   Cryptosporidium Screen (EIA) NEGATIVE  Final  Clostridium Difficile by PCR     Status: None (Preliminary result)   Collection Time: 10/14/14  3:52 PM  Result Value Ref Range Status   C difficile by pcr Not Detected Not Detected Final    Comment: This test is for use only with liquid or soft stools; performance characteristics of other clinical specimen types have not been established.   This assay was performed by Cepheid GeneXpert(R) PCR. The performance characteristics of this assay have been determined by [employer]. Performance characteristics refer to the analytical performance of the test.         Studies: Dg Abd Acute W/chest  10/18/2014   CLINICAL DATA:  Epigastric pain.  Nausea and vomiting.  EXAM: DG ABDOMEN ACUTE W/ 1V CHEST  COMPARISON:  CT 10/04/2014, ultrasound 10/04/2014, abdomen series 10/04/2014.  FINDINGS: Soft tissue  structures are unremarkable. Scattered air-fluid levels are noted in the colon. Diarrheal illness could present this fashion. No bowel distention or free air . No acute cardiopulmonary disease. Prior median sternotomy.  IMPRESSION: 1. Scattered air-fluid levels are noted in the colon. A diarrheal illness could present in this fashion. No bowel distention or free air.  2. Prior median sternotomy. Heart size stable. No acute cardiopulmonary disease.   Electronically Signed   By: Marcello Moores  Register   On: 11/06/2014 07:46        Scheduled Meds: . aspirin EC  81 mg Oral Daily  . atenolol  25 mg Oral Daily  . atorvastatin  20 mg Oral QPM  . escitalopram  20 mg Oral Daily  . famotidine  10 mg Oral QHS  . heparin  5,000 Units Subcutaneous 3 times per day  . levothyroxine  125 mcg Oral QAC breakfast  . liver oil-zinc oxide   Topical TID  . loperamide  4 mg Oral BID  . ondansetron (ZOFRAN) IV  4 mg Intravenous BID  . pantoprazole (PROTONIX) IV  40 mg Intravenous Q12H  . potassium chloride  10 mEq Intravenous Q1 Hr x 6   Continuous Infusions: . 0.9 % NaCl with KCl 40 mEq / L 125 mL/hr (10/16/14 0815)    Principal Problem:   Nausea vomiting and diarrhea Active Problems:   Metabolic acidosis, NAG, bicarbonate losses   Dehydration with hyponatremia   Hypokalemia due to loss of potassium    Time spent: 30 minutes    Eshaal Duby, MD, FACP, FHM. [facility] Pager [phone]  If 7PM-7AM, please contact night-coverage [email or web address] Password [email or web address] 10/16/2014, 12:44 PM    LOS: 1 day
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
## 2014-10-17 DIAGNOSIS — E876 Hypokalemia: Secondary | ICD-10-CM | POA: Insufficient documentation

## 2014-10-17 DIAGNOSIS — D62 Acute posthemorrhagic anemia: Secondary | ICD-10-CM | POA: Insufficient documentation

## 2014-10-17 LAB — CBC
HEMATOCRIT: 29.7 % — AB (ref 36.0–46.0)
HEMATOCRIT: 30.5 % — AB (ref 36.0–46.0)
HEMOGLOBIN: 9.8 g/dL — AB (ref 12.0–15.0)
HEMOGLOBIN: 10.1 g/dL — AB (ref 12.0–15.0)
MCH: 28.6 pg (ref 26.0–34.0)
MCH: 29.2 pg (ref 26.0–34.0)
MCHC: 33 g/dL (ref 30.0–36.0)
MCHC: 33.1 g/dL (ref 30.0–36.0)
MCV: 86.6 fL (ref 78.0–100.0)
MCV: 88.2 fL (ref 78.0–100.0)
Platelets: 255 10*3/uL (ref 150–400)
Platelets: 260 10*3/uL (ref 150–400)
RBC: 3.43 MIL/uL — ABNORMAL LOW (ref 3.87–5.11)
RBC: 3.46 MIL/uL — ABNORMAL LOW (ref 3.87–5.11)
RDW: 14.2 % (ref 11.5–15.5)
RDW: 14.1 % (ref 11.5–15.5)
WBC: 11.6 10*3/uL — ABNORMAL HIGH (ref 4.0–10.5)
WBC: 12.7 10*3/uL — AB (ref 4.0–10.5)

## 2014-10-17 LAB — COMPREHENSIVE METABOLIC PANEL
ALBUMIN: 2 g/dL — AB (ref 3.5–5.0)
ALT: 33 U/L (ref 14–54)
ANION GAP: 7 (ref 5–15)
AST: 31 U/L (ref 15–41)
Alkaline Phosphatase: 195 U/L — ABNORMAL HIGH (ref 38–126)
CHLORIDE: 104 mmol/L (ref 101–111)
CO2: 23 mmol/L (ref 22–32)
CREATININE: 0.8 mg/dL (ref 0.44–1.00)
Calcium: 8 mg/dL — ABNORMAL LOW (ref 8.9–10.3)
GFR calc Af Amer: >60 mL/min (ref >60)
GFR calc non Af Amer: >60 mL/min (ref >60)
Glucose, Bld: 103 mg/dL — ABNORMAL HIGH (ref 70–99)
POTASSIUM: 4.1 mmol/L (ref 3.5–5.1)
SODIUM: 134 mmol/L — AB (ref 135–145)
TOTAL PROTEIN: 5.4 g/dL — AB (ref 6.5–8.1)
Total Bilirubin: 1.4 mg/dL — ABNORMAL HIGH (ref 0.3–1.2)

## 2014-10-17 LAB — TYPE AND SCREEN
ABO/RH(D): A POS
Antibody Screen: NEGATIVE

## 2014-10-17 LAB — ABO/RH: ABO/RH(D): A POS

## 2014-10-17 MED ORDER — SODIUM CHLORIDE 0.9 % IV SOLN: INTRAVENOUS | Status: DC

## 2014-10-17 MED ORDER — PEG-KCL-NACL-NASULF-NA ASC-C 100 G PO SOLR
0.5000 | Freq: Once | ORAL | Status: DC
  Filled 2014-10-17: qty 1

## 2014-10-17 MED ORDER — SODIUM CHLORIDE 0.9 % IV SOLN
INTRAVENOUS | Status: DC
  Administered 2014-10-17: 21:00:00 via INTRAVENOUS

## 2014-10-17 MED ORDER — POTASSIUM CHLORIDE IN NACL 40-0.9 MEQ/L-% IV SOLN
INTRAVENOUS | Status: DC
  Administered 2014-10-17: 50 mL/h via INTRAVENOUS
  Filled 2014-10-17: qty 1000

## 2014-10-17 MED ORDER — PEG-KCL-NACL-NASULF-NA ASC-C 100 G PO SOLR: 0.5000 | Freq: Once | ORAL | Status: DC

## 2014-10-17 MED ORDER — PEG-KCL-NACL-NASULF-NA ASC-C 100 G PO SOLR: 1.0000 | Freq: Once | ORAL | Status: DC

## 2014-10-17 MED ORDER — DICLOFENAC SODIUM 1 % TD GEL
2.0000 g | Freq: Four times a day (QID) | TRANSDERMAL | Status: DC
  Administered 2014-10-17 – 2014-10-22 (×15): 2 g via TOPICAL
  Filled 2014-10-17: qty 100

## 2014-10-17 NOTE — Progress Notes (Signed)
PROGRESS NOTE    Madeline Dyer IFO:[id number] DOB: 1950-08-22 DOA: 10/28/2014 PCP: Tammi Sou, MD  Primary Gastroenterologist: Dr. Silvano Rusk Primary Urologist: Dr. Risa Grill.  HPI/Brief narrative 64 year old female with history of GERD, PUD, IBS, status post cholecystectomy, fibromyalgia, HLD, hypothyroid, status post bioprosthetic aortic valve replacement, right hydronephrosis status post stent, fatty liver, chronic diarrhea, presented to [facility] ED on 10/14/13 with approximately 4 week history of epigastric abdominal pain, intermittent daily nonbloody emesis or retching, poor appetite, 5-6 pounds weight loss, ongoing chronic diarrhea but with streaks of blood and? Occasional black stools and generalized weakness. She attempted to see GI outpatient but no early appointments were available. Admitted for further evaluation and management. [facility] consulted.   Assessment/Plan:  NSAID induced gastritis versus PUD & GERD - Likely etiology of her nausea, vomiting, abdominal pain and decreased appetite. - GI consultation appreciated: Agree with and counseled regarding complete NSAIDs cessation permanently, increase PPI to twice a day, add H2 blocker at night, changed Zofran to scheduled at least temporarily and advance diet gradually. If does not tolerate diet in the next 24-48 hours then for EGD. - H pylori IgG: Negative - Urine microscopy not impressive for UTI. - Upper GI symptoms better. Due to drop in hemoglobin, GI plans EGD and colonoscopy 5/9. - Left-sided abdominal pain may be muscular in etiology from dry heaving.  Chronic diarrhea with blood streaks - As per GI, likely related to IBS - Started Imodium. Monitor - Rotavirus: Not detected - C. difficile PCR negative. - Giardia screen: Negative - Cryptosporidium screen: Negative - Patient complains of blood streaking in stools. Approximately 2 g drop in hemoglobin since admission. GI plans EGD and colonoscopy 5/9  Acute  blood loss anemia - As indicated above, patient has dropped hemoglobin by approximately 2 g since admission which may be a combination of dilutional and blood loss from GI bleed. - Monitor CBCs closely and transfuse if hemoglobin less than 8 g per DL  Dehydration with hyponatremia - Secondary to GI losses - Improved after IV hydration  Hypokalemia - Replace as needed. Improved  Hypothyroid - Continue levothyroxin - TSH: 7.41. May be related to poor absorption in the context of GI issues. - Clinically euthyroid. - Follow TSH in 4-6 weeks.  Hyperlipidemia - Continue statins  Non-anion gap metabolic acidosis - Secondary to GI losses - Resolved    Code Status: Full Family Communication: None at bedside Disposition Plan: DC home when medically stable   Consultants:  [facility]  Procedures:  None  Antibiotics:  None   Subjective: Feels slightly better. Decreased nausea, vomiting or retching. Complaints of left quadrant abdominal pain worse during episodes of dry heaving/retching. Diarrhea unchanged and reports blood streaking in stools. Denies melena.  Objective: Filed Vitals:   10/16/14 0647 10/16/14 2033 10/17/14 0000 10/17/14 0541  BP: 103/73 105/74  100/69  Pulse: 80 80  99  Temp: 97.5 F (36.4 C) 99.9 F (37.7 C)  99.1 F (37.3 C)  TempSrc: Oral Oral    Resp:  18  18  Height:   5' 7"  (1.702 m)   Weight:      SpO2: 99% 96%  90%    Intake/Output Summary (Last 24 hours) at 10/17/14 1416 Last data filed at 10/17/14 0544  Gross per 24 hour  Intake 1843.75 ml  Output      8 ml  Net 1835.75 ml   Filed Weights   10/16/14 0144  Weight: 88.8 kg (195 lb 12.3 oz)  Exam:  General exam: Pleasant middle-aged female sitting up comfortably in bed. Oral mucosa with borderline hydration. Respiratory system: Clear. No increased work of breathing. Cardiovascular system: S1 & S2 heard, RRR. No JVD, murmurs, gallops, clicks or pedal edema. Gastrointestinal  system: Abdomen is nondistended, soft and nontender. Normal bowel sounds heard. Central nervous system: Alert and oriented. No focal neurological deficits. Extremities: Symmetric 5 x 5 power.   Data Reviewed: Basic Metabolic Panel:  Recent Labs Lab 10/14/14 1622 11/04/2014 2105 10/25/2014 2233 10/16/14 0510 10/16/14 1630 10/17/14 0452  NA 133* 130*  --  132* 133* 134*  K 3.0* 3.0*  --  2.9* 3.4* 4.1  CL 99 100*  --  101 102 104  CO2 27 16*  --  21* 20* 23  GLUCOSE 135* 121*  --  110* 110* 103*  BUN 7 6  --  6 <5* <5*  CREATININE 0.90 0.86  --  0.78 0.78 0.80  CALCIUM 9.0 8.9  --  8.3* 8.3* 8.0*  MG  --   --  1.5*  --   --   --    Liver Function Tests:  Recent Labs Lab 10/11/14 1427 10/14/14 1622 11/09/2014 2105 10/17/14 0452  AST 28 54* 51* 31  ALT 32 50* 53 33  ALKPHOS 180* 248* 223* 195*  BILITOT 0.9 1.1 1.7* 1.4*  PROT 6.2 6.8 6.8 5.4*  ALBUMIN 3.0* 3.1* 2.7* 2.0*    Recent Labs Lab 10/11/14 1427 10/14/14 1622 10/28/2014 2105  LIPASE 144.0* 158.0* 62*  AMYLASE  --  96  --    No results for input(s): AMMONIA in the last 168 hours. CBC:  Recent Labs Lab 10/11/14 1427 10/14/14 1622 11/08/2014 2105 10/16/14 0510 10/16/14 1630 10/17/14 0452  WBC 13.7* 13.3* 12.7* 12.0* 13.1* 11.6*  NEUTROABS 11.3* 10.8* 10.1*  --   --   --   HGB 12.2 12.1 11.5* 10.4* 10.4* 9.8*  HCT 35.4* 36.0 33.4* 30.5* 30.9* 29.7*  MCV 85.5 86.0 84.1 84.7 86.6 86.6  PLT 237.0 298.0 266 262 238 255   Cardiac Enzymes: No results for input(s): CKTOTAL, CKMB, CKMBINDEX, TROPONINI in the last 168 hours. BNP (last 3 results) No results for input(s): PROBNP in the last 8760 hours. CBG: No results for input(s): GLUCAP in the last 168 hours.  Recent Results (from the past 240 hour(s))  Stool culture     Status: None (Preliminary result)   Collection Time: 10/14/14  3:52 PM  Result Value Ref Range Status   Preliminary Report No Suspicious Colonies, Continuing to Hold  Preliminary     Comment: Reduced Normal Flora present  Giardia/cryptosporidium (EIA)     Status: None   Collection Time: 10/14/14  3:52 PM  Result Value Ref Range Status   Giardia Screen (EIA) NEGATIVE  Final   Cryptosporidium Screen (EIA) NEGATIVE  Final  Clostridium Difficile by PCR     Status: None (Preliminary result)   Collection Time: 10/14/14  3:52 PM  Result Value Ref Range Status   C difficile by pcr Not Detected Not Detected Final    Comment: This test is for use only with liquid or soft stools; performance characteristics of other clinical specimen types have not been established.   This assay was performed by Cepheid GeneXpert(R) PCR. The performance characteristics of this assay have been determined by [employer]. Performance characteristics refer to the analytical performance of the test.         Studies: No results found.  Scheduled Meds: . aspirin EC  81 mg Oral Daily  . atenolol  25 mg Oral Daily  . atorvastatin  20 mg Oral QPM  . escitalopram  20 mg Oral Daily  . famotidine  10 mg Oral QHS  . levothyroxine  125 mcg Oral QAC breakfast  . liver oil-zinc oxide   Topical TID  . loperamide  4 mg Oral BID  . ondansetron (ZOFRAN) IV  4 mg Intravenous BID  . pantoprazole (PROTONIX) IV  40 mg Intravenous Q12H  . peg 3350 powder  0.5 kit Oral Once   And  . [START ON 10/20/2014] peg 3350 powder  0.5 kit Oral Once   Continuous Infusions:    Principal Problem:   Nausea vomiting and diarrhea Active Problems:   Metabolic acidosis, NAG, bicarbonate losses   Dehydration with hyponatremia   Hypokalemia due to loss of potassium    Time spent: 30 minutes    Kareli Hossain, MD, FACP, FHM. [facility] Pager [phone]  If 7PM-7AM, please contact night-coverage [email or web address] Password [email or web address] 10/17/2014, 2:16 PM    LOS: 2 days

## 2014-10-17 NOTE — Progress Notes (Signed)
     [facility] Progress Note  Subjective:   Continues to have epigastric pain. Wretched last night and thinks she pulled a muscle left abd. Still with diarrhea, but had some blood steaking with stools last pm and this morning. No rectal pain. No lower abd pain. Last colonoscopy 2014 noted internal hemorrhoids.Pt was instructed at that time to f/u in GI office regarding IBS/diarrhea and internal hemorrhoids but has never done so.   Objective:  Vital signs in last 24 hours: Temp:  [99.1 F (37.3 C)-99.9 F (37.7 C)] 99.1 F (37.3 C) (05/08 0541) Pulse Rate:  [80-99] 99 (05/08 0541) Resp:  [18] 18 (05/08 0541) BP: (100-105)/(69-74) 100/69 mmHg (05/08 0541) SpO2:  [90 %-96 %] 90 % (05/08 0541) Last BM Date: 10/17/14 General:   Alert,  Well-developed,   in NAD Heart:  Regular rate and rhythm; no murmurs Pulm;lungs clear Abdomen:  Soft, nontender and nondistended. Normal bowel sounds, without guarding, and without rebound.   Extremities:  Without edema. Neurologic: Alert and  oriented x4;  grossly normal neurologically. Psych:Alert and cooperative. Normal mood and affect.  Intake/Output from previous day: 05/07 0701 - 05/08 0700 In: 2083.8 [P.O.:240; I.V.:1843.8] Out: 8 [Urine:6; Stool:2] Intake/Output this shift:    Lab Results:  Recent Labs  10/16/14 0510 10/16/14 1630 10/17/14 0452  WBC 12.0* 13.1* 11.6*  HGB 10.4* 10.4* 9.8*  HCT 30.5* 30.9* 29.7*  PLT 262 238 255  MCV 10/17/14 86.6 BMET  Recent Labs  10/16/14 0510 10/16/14 1630 10/17/14 0452  NA 132* 133* 134*  K 2.9* 3.4* 4.1  CL 101 102 104  CO2 21* 20* 23  GLUCOSE 110* 110* 103*  BUN 6 <5* <5*  CREATININE 0.78 0.78 0.80  CALCIUM 8.3* 8.3* 8.0*   LFT  Recent Labs  10/17/14 0452  PROT 5.4*  ALBUMIN 2.0*  AST 31  ALT 33  ALKPHOS 195*  BILITOT 1.4*     ASSESSMENT/PLAN:   #1. Epigastric pain, nausea and vomiting. Patient reports she has a history of ulcers dating back to her  high school years with problems with ulcers when her children were small. She has been using and states fairly regularly. She may indeed have ulcers or gastritis from NSAID use. . Gastritis likely the etiology of her positive stools.Plan on EGD tomorrow to eval for gastritis, ulcer, etc.   #2. Diarrhea. Patient reports that she has had IBS and diarrhea for most of her life. She states she has only had formed stools per approximately 1 year of her life but typically has diarrhea.Now with blood streaking with stool and drop in Hgb. Final c diff, stool cultures and IgA, TTG pending. Will plan on colonoscopy tomorrow to eval for possible colitis, etc as source of heme positive stools.    LOS: 2 days   Hvozdovic, Vita Barley PA-C 10/17/2014, Pager [phone]  ________________________________________________________________________  [facility] MD note:  I personally examined the patient, reviewed the data and agree with the assessment and plan described above. Two issues to evaluate; epigastric pain, vomiting from from NSAID abuse. Hb drop and red rectal bleeding.  Planning on egd/colonoscopy tomorrow.   Owens Loffler, MD [facility] Pager [phone]

## 2014-10-18 ENCOUNTER — Encounter (HOSPITAL_COMMUNITY): Admission: EM | Disposition: E | Payer: Self-pay | Source: Home / Self Care | Attending: Internal Medicine

## 2014-10-18 ENCOUNTER — Inpatient Hospital Stay (HOSPITAL_COMMUNITY): Payer: 59 | Admitting: Anesthesiology

## 2014-10-18 ENCOUNTER — Encounter (HOSPITAL_COMMUNITY): Payer: Self-pay | Admitting: *Deleted

## 2014-10-18 DIAGNOSIS — K2970 Gastritis, unspecified, without bleeding: Secondary | ICD-10-CM

## 2014-10-18 DIAGNOSIS — K317 Polyp of stomach and duodenum: Secondary | ICD-10-CM | POA: Insufficient documentation

## 2014-10-18 DIAGNOSIS — K2990 Gastroduodenitis, unspecified, without bleeding: Principal | ICD-10-CM

## 2014-10-18 HISTORY — PX: ESOPHAGOGASTRODUODENOSCOPY: SHX5428

## 2014-10-18 HISTORY — PX: COLONOSCOPY: SHX5424

## 2014-10-18 LAB — CBC
HCT: 30.9 % — ABNORMAL LOW (ref 36.0–46.0)
Hemoglobin: 10.2 g/dL — ABNORMAL LOW (ref 12.0–15.0)
MCH: 28.9 pg (ref 26.0–34.0)
MCHC: 33 g/dL (ref 30.0–36.0)
MCV: 87.5 fL (ref 78.0–100.0)
Platelets: 243 10*3/uL (ref 150–400)
RBC: 3.53 MIL/uL — AB (ref 3.87–5.11)
RDW: 14.2 % (ref 11.5–15.5)
WBC: 14.2 10*3/uL — ABNORMAL HIGH (ref 4.0–10.5)

## 2014-10-18 LAB — BASIC METABOLIC PANEL
Anion gap: 9 (ref 5–15)
BUN: <5 mg/dL — ABNORMAL LOW (ref 6–20)
CALCIUM: 8.4 mg/dL — AB (ref 8.9–10.3)
CO2: 24 mmol/L (ref 22–32)
Chloride: 102 mmol/L (ref 101–111)
Creatinine, Ser: 0.88 mg/dL (ref 0.44–1.00)
GFR calc Af Amer: >60 mL/min (ref >60)
GLUCOSE: 93 mg/dL (ref 70–99)
POTASSIUM: 3.8 mmol/L (ref 3.5–5.1)
SODIUM: 135 mmol/L (ref 135–145)

## 2014-10-18 LAB — STOOL CULTURE

## 2014-10-18 SURGERY — EGD (ESOPHAGOGASTRODUODENOSCOPY)
Anesthesia: Moderate Sedation

## 2014-10-18 SURGERY — COLONOSCOPY
Anesthesia: Monitor Anesthesia Care

## 2014-10-18 MED ORDER — LORAZEPAM 2 MG/ML IJ SOLN
1.0000 mg | Freq: Four times a day (QID) | INTRAMUSCULAR | Status: DC | PRN
Start: 2014-10-18 — End: 2014-10-23
  Administered 2014-10-19 – 2014-10-22 (×3): 1 mg via INTRAVENOUS
  Filled 2014-10-18 (×3): qty 1

## 2014-10-18 MED ORDER — DIPHENOXYLATE-ATROPINE 2.5-0.025 MG PO TABS
1.0000 | ORAL_TABLET | Freq: Three times a day (TID) | ORAL | Status: DC
  Administered 2014-10-19 – 2014-10-22 (×11): 1 via ORAL
  Filled 2014-10-18 (×9): qty 1

## 2014-10-18 MED ORDER — PROPOFOL INFUSION 10 MG/ML OPTIME
INTRAVENOUS | Status: DC | PRN
  Administered 2014-10-18: 75 ug/kg/min via INTRAVENOUS

## 2014-10-18 MED ORDER — SACCHAROMYCES BOULARDII 250 MG PO CAPS
250.0000 mg | ORAL_CAPSULE | Freq: Two times a day (BID) | ORAL | Status: DC
  Filled 2014-10-18 (×2): qty 1

## 2014-10-18 MED ORDER — SODIUM CHLORIDE 0.9 % IV BOLUS (SEPSIS): 500.0000 mL | Freq: Once | INTRAVENOUS | Status: DC

## 2014-10-18 MED ORDER — MIDAZOLAM HCL 5 MG/5ML IJ SOLN
INTRAMUSCULAR | Status: DC | PRN
  Administered 2014-10-18: 2 mg via INTRAVENOUS

## 2014-10-18 MED ORDER — LACTATED RINGERS IR SOLN: 1000.0000 mL | Freq: Once | Status: DC

## 2014-10-18 MED ORDER — ONDANSETRON HCL 4 MG/2ML IJ SOLN
8.0000 mg | Freq: Four times a day (QID) | INTRAMUSCULAR | Status: DC
  Administered 2014-10-18 – 2014-10-22 (×18): 8 mg via INTRAVENOUS
  Filled 2014-10-18 (×18): qty 4

## 2014-10-18 MED ORDER — BUTAMBEN-TETRACAINE-BENZOCAINE 2-2-14 % EX AERO
INHALATION_SPRAY | CUTANEOUS | Status: DC | PRN
  Administered 2014-10-18: 2 via TOPICAL

## 2014-10-18 MED ORDER — SODIUM CHLORIDE 0.9 % IV SOLN: INTRAVENOUS | Status: DC

## 2014-10-18 MED ORDER — PHENYLEPHRINE HCL 10 MG/ML IJ SOLN
INTRAMUSCULAR | Status: DC | PRN
  Administered 2014-10-18: 120 ug via INTRAVENOUS
  Administered 2014-10-18 (×3): 80 ug via INTRAVENOUS
  Administered 2014-10-18: 40 ug via INTRAVENOUS
  Administered 2014-10-18: 80 ug via INTRAVENOUS

## 2014-10-18 MED ORDER — ONDANSETRON HCL 4 MG/2ML IJ SOLN
4.0000 mg | Freq: Four times a day (QID) | INTRAMUSCULAR | Status: DC | PRN
  Administered 2014-10-18: 4 mg via INTRAVENOUS

## 2014-10-18 MED ORDER — PROPOFOL 10 MG/ML IV BOLUS
INTRAVENOUS | Status: DC | PRN
  Administered 2014-10-18: 20 mg via INTRAVENOUS

## 2014-10-18 MED ORDER — LACTATED RINGERS IV SOLN
INTRAVENOUS | Status: DC
  Administered 2014-10-18: 1000 mL via INTRAVENOUS

## 2014-10-18 MED ORDER — SODIUM CHLORIDE 0.9 % IV SOLN
INTRAVENOUS | Status: AC
  Administered 2014-10-18 – 2014-10-19 (×2): via INTRAVENOUS

## 2014-10-18 MED ORDER — FENTANYL CITRATE (PF) 100 MCG/2ML IJ SOLN
INTRAMUSCULAR | Status: DC | PRN
  Administered 2014-10-18: 50 ug via INTRAVENOUS

## 2014-10-18 MED ORDER — PROMETHAZINE HCL 25 MG/ML IJ SOLN
12.5000 mg | Freq: Four times a day (QID) | INTRAMUSCULAR | Status: DC | PRN
  Administered 2014-10-19 – 2014-10-22 (×4): 12.5 mg via INTRAVENOUS
  Filled 2014-10-18 (×5): qty 1

## 2014-10-18 NOTE — Progress Notes (Signed)
Patient began vomiting at 19:00 on 10/17/14, she has continued to have episodes of vomiting and retching since 19:00.  I called the gastroenterology doctor on call, Dr. Carlean Purl and discussed her condition.  Patient was to start a prep for a colonoscopy on 10/20/2014, unfortunately, she is unable to do this.  Patient continues to have episodes of vomiting and retching now.  Dr. Carlean Purl gave a verbal order to discontinue the prep and also added zofran 4 mg IV to help with her nausea and vomiting.Marland Kitchen

## 2014-10-18 NOTE — Progress Notes (Addendum)
PROGRESS NOTE    Robbye Dede YHC:[id number] DOB: 07/26/1950 DOA: 11/03/2014 PCP: Tammi Sou, MD  Primary Gastroenterologist: Dr. Silvano Rusk Primary Urologist: Dr. Risa Grill.  HPI/Brief narrative 64 year old female with history of GERD, PUD, IBS, status post cholecystectomy, fibromyalgia, HLD, hypothyroid, status post bioprosthetic aortic valve replacement, right hydronephrosis status post stent, fatty liver, chronic diarrhea, presented to [facility] ED on 10/14/13 with approximately 4 week history of epigastric abdominal pain, intermittent daily nonbloody emesis or retching, poor appetite, 5-6 pounds weight loss, ongoing chronic diarrhea but with streaks of blood and? Occasional black stools and generalized weakness. She attempted to see GI outpatient but no early appointments were available. Admitted for further evaluation and management. [facility] consulted.   Assessment/Plan:  NSAID induced gastritis versus PUD & GERD - Likely etiology of her nausea, vomiting, abdominal pain and decreased appetite. - GI consultation appreciated: Agree with and counseled regarding complete NSAIDs cessation permanently, increase PPI to twice a day, add H2 blocker at night, changed Zofran to scheduled at least temporarily and advance diet gradually. If does not tolerate diet in the next 24-48 hours then for EGD. - H pylori IgG: Negative - Urine microscopy not impressive for UTI. - Left-sided abdominal pain may be muscular in etiology from dry heaving. Pain better. - Had a couple of episodes of nonbloody emesis and lots of dry heaves overnight. - EGD 5/9  Chronic diarrhea with blood streaks - As per GI, likely related to IBS - Started Imodium. Monitor - Rotavirus: Not detected - C. difficile PCR negative. - Giardia screen: Negative - Cryptosporidium screen: Negative - Patient complains of blood streaking in stools. Approximately 2 g drop in hemoglobin since admission. GI plans EGD and colonoscopy  5/9 - Patient states that she has been taking "strawberry colored" clear liquids and wonders if her stool color is related to this. - Stool culture negative but showed decreased normal flora. We'll start florastor.  Acute blood loss anemia - As indicated above, patient has dropped hemoglobin by approximately 2 g since admission which may be a combination of dilutional and blood loss from GI bleed. - Stable.  Dehydration with hyponatremia - Secondary to GI losses - Improved after IV hydration - Continue gentle IV fluid hydration secondary to ongoing GI losses, nothing by mouth for procedure.  Hypokalemia - Replace as needed. Improved  Hypothyroid - Continue levothyroxin - TSH: 7.41. May be related to poor absorption in the context of GI issues. - Clinically euthyroid. - Follow TSH in 4-6 weeks.  Hyperlipidemia - Continue statins  Non-anion gap metabolic acidosis - Secondary to GI losses - Resolved  Status post bioprosthetic aortic valve replacement - Patient on atenolol 25 MG daily-unclear indication-? HTN - Soft blood pressures may be related to intravascular volume depletion - Holding parameters for atenolol - Hydrate with IV fluids and monitor closely.    Code Status: Full Family Communication: None at bedside Disposition Plan: DC home when medically stable-possibly in the next 48 hours.   Consultants:  [facility]  Procedures:  None  Antibiotics:  None   Subjective: Had 3 episodes of nonbloody emesis and lots of dry heaving/retching last night. Diarrhea persists and is wondering if her stool color is related to pink colored clear liquids that she is consuming. Left-sided abdominal pain is better.  Objective: Filed Vitals:   10/17/14 1919 10/30/2014 0144 10/13/2014 0523 10/15/2014 0524  BP: 97/61 98/61 84/46  90/66  Pulse: 87 92 80   Temp: 99.1 F (37.3 C) 99.9 F (37.7 C)  98.3 F (36.8 C)   TempSrc: Oral Oral Oral   Resp: 18 30 20    Height:      Weight:       SpO2: 93% 93%      Intake/Output Summary (Last 24 hours) at 10/20/2014 1204 Last data filed at 11/06/2014 0400  Gross per 24 hour  Intake    384 ml  Output      3 ml  Net    381 ml   Filed Weights   10/16/14 0144  Weight: 88.8 kg (195 lb 12.3 oz)     Exam:  General exam: Pleasant middle-aged female seen ambulating comfortably in the room. Oral mucosa with borderline hydration. Respiratory system: Clear. No increased work of breathing. Cardiovascular system: S1 & S2 heard, RRR. No JVD, murmurs, gallops, clicks or pedal edema. Gastrointestinal system: Abdomen is nondistended, soft and nontender. Normal bowel sounds heard. Central nervous system: Alert and oriented. No focal neurological deficits. Extremities: Symmetric 5 x 5 power.   Data Reviewed: Basic Metabolic Panel:  Recent Labs Lab 10/29/2014 2105 10/22/2014 2233 10/16/14 0510 10/16/14 1630 10/17/14 0452 10/24/2014 0451  NA 130*  --  132* 133* 134* 135  K 3.0*  --  2.9* 3.4* 4.1 3.8  CL 100*  --  101 102 104 102  CO2 16*  --  21* 20* 23 24  GLUCOSE 121*  --  110* 110* 103* 93  BUN 6  --  6 <5* <5* <5*  CREATININE 0.86  --  0.78 0.78 0.80 0.88  CALCIUM 8.9  --  8.3* 8.3* 8.0* 8.4*  MG  --  1.5*  --   --   --   --    Liver Function Tests:  Recent Labs Lab 10/11/14 1427 10/14/14 1622 11/07/2014 2105 10/17/14 0452  AST 28 54* 51* 31  ALT 32 50* 53 33  ALKPHOS 180* 248* 223* 195*  BILITOT 0.9 1.1 1.7* 1.4*  PROT 6.2 6.8 6.8 5.4*  ALBUMIN 3.0* 3.1* 2.7* 2.0*    Recent Labs Lab 10/11/14 1427 10/14/14 1622 10/30/2014 2105  LIPASE 144.0* 158.0* 62*  AMYLASE  --  96  --    No results for input(s): AMMONIA in the last 168 hours. CBC:  Recent Labs Lab 10/11/14 1427 10/14/14 1622 10/21/2014 2105 10/16/14 0510 10/16/14 1630 10/17/14 0452 10/17/14 1651 10/12/2014 0451  WBC 13.7* 13.3* 12.7* 12.0* 13.1* 11.6* 12.7* 14.2*  NEUTROABS 11.3* 10.8* 10.1*  --   --   --   --   --   HGB 12.2 12.1 11.5* 10.4*  10.4* 9.8* 10.1* 10.2*  HCT 35.4* 36.0 33.4* 30.5* 30.9* 29.7* 30.5* 30.9*  MCV 85.5 86.0 84.1 84.7 86.6 86.6 88.2 87.5  PLT 237.0 298.0 266 262 238 255 260 243   Cardiac Enzymes: No results for input(s): CKTOTAL, CKMB, CKMBINDEX, TROPONINI in the last 168 hours. BNP (last 3 results) No results for input(s): PROBNP in the last 8760 hours. CBG: No results for input(s): GLUCAP in the last 168 hours.  Recent Results (from the past 240 hour(s))  Stool culture     Status: None   Collection Time: 10/14/14  3:52 PM  Result Value Ref Range Status   Organism ID, Bacteria No Salmonella,Shigella,Campylobacter,Yersinia,or  Final   Organism ID, Bacteria No E.coli 0157:H7 isolated.  Final    Comment: Reduced Normal Flora present  Giardia/cryptosporidium (EIA)     Status: None   Collection Time: 10/14/14  3:52 PM  Result Value Ref Range Status  Giardia Screen (EIA) NEGATIVE  Final   Cryptosporidium Screen (EIA) NEGATIVE  Final  Clostridium Difficile by PCR     Status: None   Collection Time: 10/14/14  3:52 PM  Result Value Ref Range Status   C difficile by pcr Not Detected Not Detected Final    Comment: This test is for use only with liquid or soft stools; performance characteristics of other clinical specimen types have not been established.   This assay was performed by Cepheid GeneXpert(R) PCR. The performance characteristics of this assay have been determined by [employer]. Performance characteristics refer to the analytical performance of the test.   Stool culture     Status: None (Preliminary result)   Collection Time: 10/17/14  1:23 AM  Result Value Ref Range Status   Specimen Description STOOL  Final   Special Requests NONE  Final   Culture   Final    Culture reincubated for better growth Performed at [facility]    Report Status PENDING  Incomplete        Studies: No results found.      Scheduled Meds: . atenolol  25 mg Oral Daily  .  atorvastatin  20 mg Oral QPM  . diclofenac sodium  2 g Topical QID  . escitalopram  20 mg Oral Daily  . famotidine  10 mg Oral QHS  . lactated ringers  1,000 mL Irrigation Once  . levothyroxine  125 mcg Oral QAC breakfast  . liver oil-zinc oxide   Topical TID  . loperamide  4 mg Oral BID  . ondansetron (ZOFRAN) IV  4 mg Intravenous BID  . pantoprazole (PROTONIX) IV  40 mg Intravenous Q12H  . sodium chloride  500 mL Intravenous Once   Continuous Infusions: . sodium chloride    . sodium chloride 20 mL/hr at 10/17/14 2048  . sodium chloride 50 mL/hr at 11/03/2014 1018    Principal Problem:   Nausea vomiting and diarrhea Active Problems:   Metabolic acidosis, NAG, bicarbonate losses   Dehydration with hyponatremia   Hypokalemia due to loss of potassium   Hypokalemia   Acute posthemorrhagic anemia    Time spent: 30 minutes    HONGALGI,ANAND, MD, FACP, FHM. [facility] Pager [phone]  If 7PM-7AM, please contact night-coverage [email or web address] Password [email or web address] 10/20/2014, 12:04 PM    LOS: 3 days

## 2014-10-18 NOTE — Op Note (Signed)
[facility] [place], [address]   COLONOSCOPY PROCEDURE REPORT  PATIENT: Madeline Dyer, Madeline Dyer  MR#: [id number] BIRTHDATE: [birth year] , 59  yrs. old GENDER: female ENDOSCOPIST: Gatha Mayer, MD, [facility] PROCEDURE DATE:  10/29/2014 PROCEDURE:   Colonoscopy with biopsy First Screening Colonoscopy - Avg.  risk and is 50 yrs.  old or older - No.  Prior Negative Screening - Now for repeat screening. N/A  History of Adenoma - Now for follow-up colonoscopy & has been > or = to 3 yrs.  N/A ASA CLASS:   Class III INDICATIONS:Clinically significant diarrhea of unexplained origin and Patient is not applicable for Colorectal Neoplasm Risk Assessment for this procedure. MEDICATIONS: Monitored anesthesia care and Per Anesthesia  DESCRIPTION OF PROCEDURE:   After the risks benefits and alternatives of the procedure were thoroughly explained, informed consent was obtained.  The digital rectal exam revealed no abnormalities of the rectum.   The Pentax Adult Colon [id number] endoscope was introduced through the anus and advanced to the terminal ileum which was intubated for a short distance. No adverse events experienced.   The quality of the prep was fair.  none  The instrument was then slowly withdrawn as the colon was fully examined.    COLON FINDINGS: The examined terminal ileum appeared to be normal. A sessile polyp measuring 2 mm in size was found in the rectum.  A polypectomy was performed with cold forceps.  The resection was complete, the polyp tissue was completely retrieved and sent to histology.   The examination was otherwise normal. Random colon biopsies taken. Rectal retroflexion as described (polyp). The time to cecum = 2.8 Withdrawal time = 7.2   The scope was withdrawn and the procedure completed. COMPLICATIONS: There were no immediate complications.  ENDOSCOPIC IMPRESSION: 1.   The examined terminal ileum appeared to be normal 2.    Sessile polyp was found in the rectum; polypectomy was performed with cold forceps 3.   The examination was otherwise normal - no prep - fair look at mucosa - random colon bxs taken to look for microscopic colitis  RECOMMENDATIONS: 1.  Await pathology results 2.  Make ondansetron 8 mg IV q6 Lomotil 1 AC tid lorazepam 1 mg q6 prn Clear liquids I suspect a severe functional problem (IBS) most likely or perhaps she had a gastroenteritis and is very slow to recover as she has bad IBS 3.  Anemia most likely dilutional  eSigned:  Gatha Mayer, MD, [facility] 11/05/2014 1:37 PM

## 2014-10-18 NOTE — Anesthesia Postprocedure Evaluation (Signed)
Anesthesia Post Note  Patient: Madeline Dyer  Procedure(s) Performed: Procedure(s) (LRB): COLONOSCOPY (N/A) ESOPHAGOGASTRODUODENOSCOPY (EGD) (N/A)  Anesthesia type: MAC  Patient location: PACU  Post pain: Pain level controlled  Post assessment: Patient's Cardiovascular Status Stable  Last Vitals:  Filed Vitals:   11/08/2014 1344  BP: 86/64  Pulse: 79  Temp:   Resp: 29    Post vital signs: Reviewed and stable  Level of consciousness: sedated  Complications: No apparent anesthesia complications

## 2014-10-18 NOTE — Transfer of Care (Signed)
Immediate Anesthesia Transfer of Care Note  Patient: Madeline Dyer  Procedure(s) Performed: Procedure(s): COLONOSCOPY (N/A) ESOPHAGOGASTRODUODENOSCOPY (EGD) (N/A)  Patient Location: Endoscopy Unit  Anesthesia Type:MAC  Level of Consciousness: awake, alert , oriented and patient cooperative  Airway & Oxygen Therapy: Patient Spontanous Breathing and Patient connected to nasal cannula oxygen  Post-op Assessment: Report given to RN, Post -op Vital signs reviewed and stable and Patient moving all extremities  Post vital signs: Reviewed and stable  Last Vitals:  Filed Vitals:   11/08/2014 1328  BP: 83/54  Pulse: 83  Temp:   Resp: 22    Complications: No apparent anesthesia complications

## 2014-10-18 NOTE — Op Note (Signed)
[facility] [place], [address]   ENDOSCOPY PROCEDURE REPORT  PATIENT: Madeline, Dyer  MR#: [id number] BIRTHDATE: December 25, 1950 , 61  yrs. old GENDER: female ENDOSCOPIST: Gatha Mayer, MD, [facility] PROCEDURE DATE:  10/13/2014 PROCEDURE:  EGD w/ biopsy ASA CLASS:     Class III INDICATIONS:  nausea and vomiting. MEDICATIONS: Per Anesthesia and Monitored anesthesia care TOPICAL ANESTHETIC: none  DESCRIPTION OF PROCEDURE: After the risks benefits and alternatives of the procedure were thoroughly explained, informed consent was obtained.  The Pentax Gastroscope [id number] endoscope was introduced through the mouth and advanced to the second portion of the duodenum , Without limitations.  The instrument was slowly withdrawn as the mucosa was fully examined.    1) Numerous diminutive soft, fleshy polyps in gastric body and fundus.  Biopsies of some taken. 2) Patchy amntral erythema and subepithelial heme - biopsies taken.  ? Gastritis 3) Otherwise normal EGD.  Retroflexed views revealed as previously described. The scope was then withdrawn from the patient and the procedure completed.  COMPLICATIONS: There were no immediate complications.  ENDOSCOPIC IMPRESSION: 1) Numerous diminutive soft, fleshy polyps in gastric body and fundus.  Biopsies of some taken. 2) Patchy amntral erythema and subepithelial heme - biopsies taken. ? Gastritis 3) Otherwise normal EGD  RECOMMENDATIONS: Proceed with a Colonoscopy.    eSigned:  Gatha Mayer, MD, [facility] 11/08/2014 1:31 PM

## 2014-10-18 NOTE — Anesthesia Preprocedure Evaluation (Signed)
Anesthesia Evaluation  Patient identified by MRN, date of birth, ID band Patient awake    Reviewed: Allergy & Precautions, NPO status , Patient's Chart, lab work & pertinent test results  Airway Mallampati: I  TM Distance: >3 FB Neck ROM: Full    Dental  (+) Dental Advisory Given   Pulmonary former smoker,    Pulmonary exam normal       Cardiovascular hypertension, Pt. on medications Normal cardiovascular exam    Neuro/Psych PSYCHIATRIC DISORDERS Anxiety negative neurological ROS     GI/Hepatic Neg liver ROS, GERD-  ,  Endo/Other  Hypothyroidism   Renal/GU negative Renal ROS  negative genitourinary   Musculoskeletal  (+) Arthritis -,   Abdominal   Peds negative pediatric ROS (+)  Hematology negative hematology ROS (+) anemia ,   Anesthesia Other Findings   Reproductive/Obstetrics negative OB ROS                             Anesthesia Physical Anesthesia Plan  ASA: III  Anesthesia Plan: MAC   Post-op Pain Management:    Induction: Intravenous  Airway Management Planned: Simple Face Mask  Additional Equipment:   Intra-op Plan:   Post-operative Plan:   Informed Consent: I have reviewed the patients History and Physical, chart, labs and discussed the procedure including the risks, benefits and alternatives for the proposed anesthesia with the patient or authorized representative who has indicated his/her understanding and acceptance.   Dental advisory given  Plan Discussed with: CRNA, Anesthesiologist and Surgeon  Anesthesia Plan Comments:         Anesthesia Quick Evaluation

## 2014-10-19 ENCOUNTER — Inpatient Hospital Stay (HOSPITAL_COMMUNITY): Payer: 59

## 2014-10-19 ENCOUNTER — Ambulatory Visit: Payer: 59 | Admitting: Physician Assistant

## 2014-10-19 ENCOUNTER — Encounter (HOSPITAL_COMMUNITY): Payer: Self-pay | Admitting: Internal Medicine

## 2014-10-19 DIAGNOSIS — R509 Fever, unspecified: Secondary | ICD-10-CM | POA: Insufficient documentation

## 2014-10-19 DIAGNOSIS — I959 Hypotension, unspecified: Secondary | ICD-10-CM | POA: Insufficient documentation

## 2014-10-19 LAB — BASIC METABOLIC PANEL
Anion gap: 12 (ref 5–15)
BUN: 5 mg/dL — AB (ref 6–20)
CHLORIDE: 101 mmol/L (ref 101–111)
CO2: 24 mmol/L (ref 22–32)
Calcium: 8.2 mg/dL — ABNORMAL LOW (ref 8.9–10.3)
Creatinine, Ser: 0.83 mg/dL (ref 0.44–1.00)
GFR calc Af Amer: >60 mL/min (ref >60)
GFR calc non Af Amer: >60 mL/min (ref >60)
GLUCOSE: 80 mg/dL (ref 70–99)
POTASSIUM: 3.7 mmol/L (ref 3.5–5.1)
SODIUM: 137 mmol/L (ref 135–145)

## 2014-10-19 LAB — CBC
HCT: 31.2 % — ABNORMAL LOW (ref 36.0–46.0)
HEMOGLOBIN: 10.1 g/dL — AB (ref 12.0–15.0)
MCH: 28.2 pg (ref 26.0–34.0)
MCHC: 32.4 g/dL (ref 30.0–36.0)
MCV: 87.2 fL (ref 78.0–100.0)
PLATELETS: 231 10*3/uL (ref 150–400)
RBC: 3.58 MIL/uL — ABNORMAL LOW (ref 3.87–5.11)
RDW: 14.2 % (ref 11.5–15.5)
WBC: 11.6 10*3/uL — ABNORMAL HIGH (ref 4.0–10.5)

## 2014-10-19 LAB — TISSUE TRANSGLUTAMINASE, IGA: Tissue Transglutaminase Ab, IgA: <2 U/mL (ref 0–3)

## 2014-10-19 MED ORDER — COLESTIPOL HCL 5 G PO PACK
5.0000 g | PACK | Freq: Every day | ORAL | Status: DC
  Filled 2014-10-19: qty 1

## 2014-10-19 MED ORDER — PANTOPRAZOLE SODIUM 40 MG PO TBEC
40.0000 mg | DELAYED_RELEASE_TABLET | Freq: Two times a day (BID) | ORAL | Status: DC
  Administered 2014-10-19: 40 mg via ORAL
  Filled 2014-10-19: qty 1

## 2014-10-19 MED ORDER — HYDROMORPHONE HCL 1 MG/ML IJ SOLN: 0.2500 mg | INTRAMUSCULAR | Status: DC | PRN

## 2014-10-19 MED ORDER — COLESTIPOL HCL 1 G PO TABS
5.0000 g | ORAL_TABLET | Freq: Every day | ORAL | Status: DC
  Filled 2014-10-19: qty 5

## 2014-10-19 MED ORDER — COLESTIPOL HCL 5 G PO PACK
5.0000 g | PACK | Freq: Every day | ORAL | Status: DC
  Administered 2014-10-19 – 2014-10-22 (×2): 5 g via ORAL
  Filled 2014-10-19 (×5): qty 1

## 2014-10-19 MED ORDER — SODIUM CHLORIDE 0.9 % IV SOLN
INTRAVENOUS | Status: AC
  Administered 2014-10-19: 20:00:00 via INTRAVENOUS

## 2014-10-19 NOTE — Progress Notes (Signed)
PROGRESS NOTE    Madeline Dyer VQQ:[id number] DOB: [birth year] DOA: 10/10/2014 PCP: Tammi Sou, MD  Primary Gastroenterologist: Dr. Silvano Rusk Primary Urologist: Dr. Risa Grill.  HPI/Brief narrative 64 year old female with history of GERD, PUD, IBS, status post cholecystectomy, fibromyalgia, HLD, hypothyroid, status post bioprosthetic aortic valve replacement, right hydronephrosis status post stent, fatty liver, chronic diarrhea, presented to [facility] ED on 10/14/13 with approximately 4 week history of epigastric abdominal pain, intermittent daily nonbloody emesis or retching, poor appetite, 5-6 pounds weight loss, ongoing chronic diarrhea but with streaks of blood and? Occasional black stools and generalized weakness. She attempted to see GI outpatient but no early appointments were available. Admitted for further evaluation and management. [facility] consulted.   Assessment/Plan:  Nausea, vomiting, abdominal pain - Initially felt to be secondary to NSAID-induced gastritis or PUD -GI was consulted and managed conservatively. Despite this she continued to be symptomatic and had some blood in stools and dropped hemoglobin by approximately 2 g. She then underwent EGD and colonoscopy with results as below - GI feels her presentation is mostly from IBS and are making medication changes.  - H pylori IgG: Negative - Urine microscopy not impressive for UTI. - Left-sided abdominal pain may be muscular in etiology from dry heaving. Pain better. - Had a couple of episodes of nonbloody emesis and lots of dry heaves overnight.   Chronic diarrhea with blood streaks - As per GI, likely related to IBS - Started Imodium. Monitor - Rotavirus: Not detected - C. difficile PCR negative. - Giardia screen: Negative - Cryptosporidium screen: Negative - Stool culture negative but showed decreased normal flora. We'll start florastor. - Please see above  Acute blood loss anemia - As indicated above,  patient has dropped hemoglobin by approximately 2 g since admission which may be a combination of dilutional and blood loss from GI bleed. - Stable.  Dehydration with hyponatremia - Secondary to GI losses - Improved after IV hydration  Hypokalemia - Replace as needed. Improved  Hypothyroid - Continue levothyroxin - TSH: 7.41. May be related to poor absorption in the context of GI issues. - Clinically euthyroid. - Follow TSH in 4-6 weeks.  Hyperlipidemia - Continue statins  Non-anion gap metabolic acidosis - Secondary to GI losses - Resolved  Status post bioprosthetic aortic valve replacement - Patient on atenolol 25 MG daily-unclear indication-? HTN - Soft blood pressures may be related to intravascular volume depletion - Holding parameters for atenolol - Hydrate with IV fluids and monitor closely.  Hypotension - Not entirely clear why she is on atenolol. Had symptomatic low blood pressure with systolic in the 43P. DC atenolol. IV normal saline bolus and maintenance fluid. Follow clinically  Night sweats - Patient states that she's been having night sweats regularly for years dating back to 2011. Denies cough. Etiology unclear.  Low-grade fevers - No clinical focus of sepsis - Urine microscopy 5/6 was not impressive for UTI - Chest x-ray was negative for acute findings - Will repeat chest x-ray, urine microscopy and a set of blood cultures - May need to start antibiotics if she spikes fever greater than 10 66F     Code Status: Full Family Communication: None at bedside Disposition Plan: DC home when medically stable-possibly in the next 48 hours.   Consultants:  [facility]  Procedures: Colonoscopy 10/17/2014: rectal polyp removed, o/w normal to TI. Random biopsies to r/o microscopic colitis.  EGD 10/30/2014: benign appearing gastric polyps, patchy gastritis (erythema, subepithelial heme, gastritis. Biopsies obtained and pending.  Antibiotics:  None    Subjective: No nausea or vomiting since yesterday. Diarrhea unchanged but no blood in it. Complaints of chronic soaking sweats at night. Low-grade fevers. No cough, dyspnea, dysuria or urinary frequency.   Objective: Filed Vitals:   10/19/14 1415 10/19/14 1420 10/19/14 1433 10/19/14 1457  BP: 81/41 86/54 90/60  83/54  Pulse:  88 82 92  Temp:    100.7 F (38.2 C)  TempSrc:      Resp:    20  Height:      Weight:      SpO2:    93%    Intake/Output Summary (Last 24 hours) at 10/19/14 1841 Last data filed at 10/19/14 1200  Gross per 24 hour  Intake    590 ml  Output      0 ml  Net    590 ml   Filed Weights   10/16/14 0144  Weight: 88.8 kg (195 lb 12.3 oz)     Exam:  General exam: Pleasant middle-aged female seen ambulating comfortably in the room. Oral mucosa with borderline hydration. Respiratory system: Clear. No increased work of breathing. Cardiovascular system: S1 & S2 heard, RRR. No JVD, murmurs, gallops, clicks or pedal edema. Gastrointestinal system: Abdomen is nondistended, soft and nontender. Normal bowel sounds heard. Central nervous system: Alert and oriented. No focal neurological deficits. Extremities: Symmetric 5 x 5 power.   Data Reviewed: Basic Metabolic Panel:  Recent Labs Lab 10/27/2014 2233 10/16/14 0510 10/16/14 1630 10/17/14 0452 10/25/2014 0451 10/19/14 0600  NA  --  132* 133* 134* 135 137  K  --  2.9* 3.4* 4.1 3.8 3.7  CL  --  101 102 104 102 101  CO2  --  21* 20* 23 24 24   GLUCOSE  --  110* 110* 103* 93 80  BUN  --  6 <5* <5* <5* 5*  CREATININE  --  0.78 0.78 0.80 0.88 0.83  CALCIUM  --  8.3* 8.3* 8.0* 8.4* 8.2*  MG 1.5*  --   --   --   --   --    Liver Function Tests:  Recent Labs Lab 10/14/14 1622 10/28/2014 2105 10/17/14 0452  AST 54* 51* 31  ALT 50* 53 33  ALKPHOS 248* 223* 195*  BILITOT 1.1 1.7* 1.4*  PROT 6.8 6.8 5.4*  ALBUMIN 3.1* 2.7* 2.0*    Recent Labs Lab 10/14/14 1622 10/20/2014 2105  LIPASE 158.0* 62*   AMYLASE 96  --    No results for input(s): AMMONIA in the last 168 hours. CBC:  Recent Labs Lab 10/14/14 1622 11/05/2014 2105  10/16/14 1630 10/17/14 0452 10/17/14 1651 10/13/2014 0451 10/19/14 0600  WBC 13.3* 12.7*  < > 13.1* 11.6* 12.7* 14.2* 11.6*  NEUTROABS 10.8* 10.1*  --   --   --   --   --   --   HGB 12.1 11.5*  < > 10.4* 9.8* 10.1* 10.2* 10.1*  HCT 36.0 33.4*  < > 30.9* 29.7* 30.5* 30.9* 31.2*  MCV 86.0 84.1  < > 86.6 86.6 88.2 87.5 87.2  PLT 298.0 266  < > 238 255 260 243 231  < > = values in this interval not displayed. Cardiac Enzymes: No results for input(s): CKTOTAL, CKMB, CKMBINDEX, TROPONINI in the last 168 hours. BNP (last 3 results) No results for input(s): PROBNP in the last 8760 hours. CBG: No results for input(s): GLUCAP in the last 168 hours.  Recent Results (from the past 240 hour(s))  Stool culture  Status: None   Collection Time: 10/14/14  3:52 PM  Result Value Ref Range Status   Organism ID, Bacteria No Salmonella,Shigella,Campylobacter,Yersinia,or  Final   Organism ID, Bacteria No E.coli [id number]:H7 isolated.  Final    Comment: Reduced Normal Flora present  Giardia/cryptosporidium (EIA)     Status: None   Collection Time: 10/14/14  3:52 PM  Result Value Ref Range Status   Giardia Screen (EIA) NEGATIVE  Final   Cryptosporidium Screen (EIA) NEGATIVE  Final  Clostridium Difficile by PCR     Status: None   Collection Time: 10/14/14  3:52 PM  Result Value Ref Range Status   C difficile by pcr Not Detected Not Detected Final    Comment: This test is for use only with liquid or soft stools; performance characteristics of other clinical specimen types have not been established.   This assay was performed by Cepheid GeneXpert(R) PCR. The performance characteristics of this assay have been determined by [facility]. Performance characteristics refer to the analytical performance of the test.   Stool culture     Status: None (Preliminary  result)   Collection Time: 10/17/14  1:23 AM  Result Value Ref Range Status   Specimen Description STOOL  Final   Special Requests NONE  Final   Culture   Final    NO SUSPICIOUS COLONIES, CONTINUING TO HOLD Note: REDUCED NORMAL FLORA PRESENT Performed at [facility]    Report Status PENDING  Incomplete        Studies: No results found.      Scheduled Meds: . atenolol  25 mg Oral Daily  . atorvastatin  20 mg Oral QPM  . colestipol  5 g Oral Q supper  . diclofenac sodium  2 g Topical QID  . diphenoxylate-atropine  1 tablet Oral TID AC  . escitalopram  20 mg Oral Daily  . famotidine  10 mg Oral QHS  . levothyroxine  125 mcg Oral QAC breakfast  . liver oil-zinc oxide   Topical TID  . ondansetron  8 mg Intravenous Q6H  . pantoprazole  40 mg Oral BID  . sodium chloride  500 mL Intravenous Once   Continuous Infusions:    Principal Problem:   Nausea vomiting and diarrhea Active Problems:   Metabolic acidosis, NAG, bicarbonate losses   Dehydration with hyponatremia   Hypokalemia due to loss of potassium   Hypokalemia   Acute posthemorrhagic anemia   Multiple gastric polyps   Gastritis and gastroduodenitis    Time spent: 18 minutes    Asiyah Pineau, MD, FACP, FHM. [facility] Pager [phone]  If 7PM-7AM, please contact night-coverage [email or web address] Password [email or web address] 10/19/2014, 6:41 PM    LOS: 4 days

## 2014-10-19 NOTE — Progress Notes (Signed)
Patient had 4-5 episodes of night sweats throughout the night, clothing and sheets were saturated.

## 2014-10-19 NOTE — Progress Notes (Signed)
Daily Rounding Note  10/19/2014, 10:13 AM  LOS: 4 days   SUBJECTIVE: n/v improved.  Still having up to 8 watery stools per day (pattern for at least 8 years).  No dizziness, no chest pain, no SOB.  Still feels unwell.  Would like to try solids, dislikes broth as it is too fatty for her.   OBJECTIVE:         Vital signs in last 24 hours:    Temp:  [98.2 F (36.8 C)-100.7 F (38.2 C)] 98.2 F (36.8 C) (05/10 0536) Pulse Rate:  [79-102] 96 (05/10 0536) Resp:  [18-29] 20 (05/10 0536) BP: (83-106)/(42-68) 102/63 mmHg (05/10 0536) SpO2:  [91 %-95 %] 94 % (05/10 0536) Last BM Date: 10/12/2014 Filed Weights   10/16/14 0144  Weight: 195 lb 12.3 oz (88.8 kg)   General: looks unwell, not acutely ill.  Arouseable, comfortable   Heart: RRR Chest: clear bil.  No labored breathing Abdomen: soft, mild LUQ tenderness.  Active BS.  Obese.   Extremities: no CCE.   Neuro/Psych:  Cooperative, pleasant, oriented x 3.  No tremor or limb weakness.   Intake/Output from previous day: 05/09 0701 - 05/10 0700 In: 517.2 [I.V.:517.2] Out: -   Intake/Output this shift:    Lab Results:  Recent Labs  10/17/14 1651 10/17/2014 0451 10/19/14 0600  WBC 12.7* 14.2* 11.6*  HGB 10.1* 10.2* 10.1*  HCT 30.5* 30.9* 31.2*  PLT 260 243 231   BMET  Recent Labs  10/17/14 0452 11/01/2014 0451 10/19/14 0600  NA 134* 135 137  K 4.1 3.8 3.7  CL 104 102 101  CO2 23 24 24   GLUCOSE 103* 93 80  BUN <5* <5* 5*  CREATININE 0.80 0.88 0.83  CALCIUM 8.0* 8.4* 8.2*   LFT  Recent Labs  10/17/14 0452  PROT 5.4*  ALBUMIN 2.0*  AST 31  ALT 33  ALKPHOS 195*  BILITOT 1.4*   Scheduled Meds: . atenolol  25 mg Oral Daily  . atorvastatin  20 mg Oral QPM  . diclofenac sodium  2 g Topical QID  . diphenoxylate-atropine  1 tablet Oral TID AC  . escitalopram  20 mg Oral Daily  . famotidine  10 mg Oral QHS  . levothyroxine  125 mcg Oral QAC breakfast  .  liver oil-zinc oxide   Topical TID  . ondansetron  8 mg Intravenous Q6H  . pantoprazole (PROTONIX) IV  40 mg Intravenous Q12H  . sodium chloride  500 mL Intravenous Once   Continuous Infusions:  PRN Meds:.acetaminophen, HYDROmorphone (DILAUDID) injection, LORazepam, promethazine   ASSESMENT:   *  Epigastric pain, N/V. Chronic diarrhea but more recently streaks of blood.  Elevated Lipase, alk phos and AST/ALT: resolved or improving.  No evidence pancreatitis on CT.  Pancreas obscured on ultrasound.  Hx diminutive adenoma 01/2013.  Colonoscopy 10/14/2014: rectal polyp removed, o/w normal to TI.  Random biopsies to r/o microscopic colitis.  EGD 11/05/2014: benign appearing gastric polyps, patchy gastritis (erythema, subepithelial heme, gastritis.  Biopsies obtained and pending.  Was taking 600 mg Ibuprofen 3 to 4 times per day for headaches and later for LUQ pain that she developed after lot of vigorous retching.  Dr Carlean Purl suspects functional GI issues. On Lomotil TID. BID IV Protonix, HS oral pepcid.   *  Splenomegaly, stable steatosis, stable/chronic right intrarenal dilation per CT scan.    PLAN   *  Await pathology.   *  Switch to po  Protonix.  Advance to soft, low fat diet. Pt already endorsing no future use of NSAIDs.     Madeline Dyer  10/19/2014, 10:13 AM Pager: [phone]   GI Attending  I have also seen and assessed the patient and agree with the advanced practitioner's assessment and plan. Hx/PE same  Pathology ok - no colitis - polyps benign, reactive gastropathy  Has had some soft BP's Dizzy earlier HA Has had a soft diet  "I still feel bad"  I think she has IBS that was exacerbated  She is improved  Will add colestipol for diarrhea  She denies stressors to any great degree but possible  She could be a candidate for alosetron, Viberzi - will try colestipol first Will need to reduce Lomotil I mentioned possibly going home as soon as tomorrow and that's  when she said she was still feeling bad  Gatha Mayer, MD, [facility] [phone] (pager) 10/19/2014 4:33 PM

## 2014-10-19 NOTE — Progress Notes (Signed)
1410:  Pt. called out from room; diaphoretic (linens saturated) c/o dizziness sitting EOB.  B/P 84/48 HRR at 92, Sat. 93% RA.  Denies nausea, no emesis, no additional pain other then baseline lt. sided abd. discomfort.  Pt. had total 400cc po flds. from 7a-1400.  IVFs at 100cc/hr.  Pt. anxious, tearful during episode.  Temp. 99.1.   1420:  86/54. HRR 88;  1500  90/60, HR 89. Dr. Algis Liming made aware, orders received. 1800:  No further c/o.Marland Kitchen Pt. resting quietly.  T 100.7 at 1600.  Monitor.

## 2014-10-20 ENCOUNTER — Encounter (HOSPITAL_COMMUNITY): Payer: Self-pay | Admitting: Physician Assistant

## 2014-10-20 ENCOUNTER — Inpatient Hospital Stay (HOSPITAL_COMMUNITY): Payer: 59

## 2014-10-20 DIAGNOSIS — R609 Edema, unspecified: Secondary | ICD-10-CM

## 2014-10-20 LAB — URINALYSIS, ROUTINE W REFLEX MICROSCOPIC
Bilirubin Urine: NEGATIVE
GLUCOSE, UA: NEGATIVE mg/dL
HGB URINE DIPSTICK: NEGATIVE
Ketones, ur: 15 mg/dL — AB
LEUKOCYTES UA: NEGATIVE
Nitrite: NEGATIVE
PROTEIN: NEGATIVE mg/dL
Specific Gravity, Urine: 1.009 (ref 1.005–1.030)
UROBILINOGEN UA: 2 mg/dL — AB (ref 0.0–1.0)
pH: 6 (ref 5.0–8.0)

## 2014-10-20 MED ORDER — LEVOTHYROXINE SODIUM 125 MCG PO TABS
125.0000 ug | ORAL_TABLET | Freq: Every day | ORAL | Status: DC
  Administered 2014-10-20 – 2014-10-22 (×3): 125 ug via ORAL
  Filled 2014-10-20 (×4): qty 1

## 2014-10-20 MED ORDER — PANTOPRAZOLE SODIUM 40 MG PO TBEC
40.0000 mg | DELAYED_RELEASE_TABLET | Freq: Every day | ORAL | Status: DC
  Administered 2014-10-21 – 2014-10-22 (×2): 40 mg via ORAL
  Filled 2014-10-20 (×3): qty 1

## 2014-10-20 MED ORDER — SODIUM CHLORIDE 0.9 % IV SOLN
INTRAVENOUS | Status: DC
  Administered 2014-10-20 – 2014-10-21 (×2): via INTRAVENOUS
  Administered 2014-10-22: 75 mL/h via INTRAVENOUS
  Administered 2014-10-22: 20:00:00 via INTRAVENOUS

## 2014-10-20 MED ORDER — VANCOMYCIN HCL IN DEXTROSE 1-5 GM/200ML-% IV SOLN
1000.0000 mg | Freq: Two times a day (BID) | INTRAVENOUS | Status: DC
  Administered 2014-10-20 – 2014-10-22 (×4): 1000 mg via INTRAVENOUS
  Filled 2014-10-20 (×7): qty 200

## 2014-10-20 NOTE — Progress Notes (Signed)
*  PRELIMINARY RESULTS* Vascular Ultrasound Lower extremity venous duplex has been completed.  Preliminary findings: negative for DVT  .Landry Mellow, RDMS, RVT  10/20/2014, 5:16 PM
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
## 2014-10-20 NOTE — Progress Notes (Signed)
Initial Nutrition Assessment  DOCUMENTATION CODES:  Obesity unspecified  INTERVENTION:  Snacks (Cheese and crackers) Ordered.  Pt refused nutritional supplements at this time.  Encourage adequate PO intake.  NUTRITION DIAGNOSIS:  Inadequate oral intake related to nausea, vomiting as evidenced by meal completion < 50%.  GOAL:  Patient will meet greater than or equal to 90% of their needs  MONITOR:  PO intake, Weight trends, Labs, I & O's  REASON FOR ASSESSMENT:  Consult Assessment of nutrition requirement/status  ASSESSMENT: Pt presents to the ED with N/V/D onset on 09/27/14, and persistent and worsening since then and unable to keep down PO fluids. Pt with history of GERD, PUD, IBS, status post cholecystectomy, fibromyalgia, HLD, hypothyroid, status post bioprosthetic aortic valve replacement, right hydronephrosis status post stent, fatty liver, chronic diarrhea.  Colonoscopy 10/24/2014: rectal polyp removed. EGD 11/04/2014: benign appearing gastric polyps, patchy gastritis   Pt reports appetite has been down over the past 1 month due to nausea, vomiting, and abdominal pains. Pt has been however still consuming 2 meals a day. Diet recall includes toast and cheese for breakfast and either chicken, [place], pork or seafood for dinner. Weight has been stable. Pt reports she has noticed she has been extremely tired and fatigued over the past month. Pt was educated to eat at least 2-3 meals a day at home and to continue her multivitamin intake. Pt was offered a nutritional supplements since current po intake has been 0-50% due to n/v, however pt refused. Pt is agreeable to snacks (cheese and crackers). RD to order. Pt was encouraged to eat her food at meals.   Labs and medications reviewed.  Height:  Ht Readings from Last 1 Encounters:  10/17/14 5\' 7"  (1.702 m)    Weight:  Wt Readings from Last 1 Encounters:  10/16/14 195 lb 12.3 oz (88.8 kg)    Ideal Body Weight:  61 kg  Wt  Readings from Last 10 Encounters:  10/16/14 195 lb 12.3 oz (88.8 kg)  10/14/14 194 lb (87.998 kg)  10/11/14 199 lb (90.266 kg)  10/04/14 198 lb (89.812 kg)  07/07/14 201 lb 8 oz (91.4 kg)  05/12/14 207 lb (93.895 kg)  04/21/14 204 lb (92.534 kg)  10/28/13 200 lb (90.719 kg)  08/17/13 205 lb (92.987 kg)  01/22/13 197 lb (89.359 kg)    BMI:  Body mass index is 30.65 kg/(m^2). Class I obesity  Estimated Nutritional Needs:  Kcal:  1850-2050  Protein:  90-105 grams  Fluid:  1.8 - 2 L/day  Skin:  Reviewed, no issues  Diet Order:  DIET SOFT Room service appropriate?: Yes; Fluid consistency:: Thin  EDUCATION NEEDS:  Education needs addressed   Intake/Output Summary (Last 24 hours) at 10/20/14 1206 Last data filed at 10/20/14 0900  Gross per 24 hour  Intake    480 ml  Output    250 ml  Net    230 ml    Last BM:  5/11  Kallie Locks, MS, RD, LDN Pager # [phone] After hours/ weekend pager # [phone]

## 2014-10-20 NOTE — Progress Notes (Signed)
Daily Rounding Note  10/20/2014, 9:34 AM  LOS: 5 days   SUBJECTIVE:       Episode with hypotension, 84/48, but pulse in the low 90s. Patient complaining of dizziness and noted to be diaphoretic, tearful and anxious. Temperature to 100.7. Received a bolus of IV fluids. Fells well this AM.  No nausea, no abd pain.  Had about 5 loose to soft BMs yesterday, 2 so far today.   OBJECTIVE:         Vital signs in last 24 hours:    Temp:  [97.9 F (36.6 C)-100.7 F (38.2 C)] 100.5 F (38.1 C) (05/11 0600) Pulse Rate:  [82-96] 96 (05/11 0600) Resp:  [18-24] 18 (05/11 0600) BP: (81-109)/(41-73) 109/73 mmHg (05/11 0600) SpO2:  [91 %-97 %] 97 % (05/11 0600) Last BM Date: 10/19/14 Filed Weights   10/16/14 0144  Weight: 195 lb 12.3 oz (88.8 kg)   General: unwell but not acutely ill looking.    Heart: RRR Chest: clear bil.  No cough or SOB Abdomen: soft, NT, active BS.  Extremities: no CCE Neuro/Psych:  Pleasant, oriented x 3.    Intake/Output from previous day: 05/10 0701 - 05/11 0700 In: 1070 [P.O.:1070] Out: 250 [Urine:250]  Intake/Output this shift:    Lab Results:  Recent Labs  10/17/14 1651 10/26/2014 0451 10/19/14 0600  WBC 12.7* 14.2* 11.6*  HGB 10.1* 10.2* 10.1*  HCT 30.5* 30.9* 31.2*  PLT 260 243 231   BMET  Recent Labs  11/04/2014 0451 10/19/14 0600  NA 135 137  K 3.8 3.7  CL 102 101  CO2 24 24  GLUCOSE 93 80  BUN <5* 5*  CREATININE 0.88 0.83  CALCIUM 8.4* 8.2*   LFT No results for input(s): PROT, ALBUMIN, AST, ALT, ALKPHOS, BILITOT, BILIDIR, IBILI in the last 72 hours. PT/INR No results for input(s): LABPROT, INR in the last 72 hours. Hepatitis Panel No results for input(s): HEPBSAG, HCVAB, HEPAIGM, HEPBIGM in the last 72 hours.  Studies/Results: Dg Chest 2 View  10/19/2014   CLINICAL DATA:  Mid chest pain under left breast with shortness of breath. Fever since 09/26/2014. Prior aortic  valve replacement.  EXAM: CHEST  2 VIEW  COMPARISON:  10/14/2014 acute abdomen series.  FINDINGS: Prior median sternotomy. Lateral view degraded by patient arm position. Midline trachea. Mild cardiomegaly. No pleural effusion or pneumothorax. No congestive failure. Mild left base scarring.  IMPRESSION: Cardiomegaly, without congestive failure.  No explanation for fever.   Electronically Signed   By: Abigail Miyamoto M.D.   On: 10/19/2014 20:13   Scheduled Meds: . atorvastatin  20 mg Oral QPM  . colestipol  5 g Oral Q supper  . diclofenac sodium  2 g Topical QID  . diphenoxylate-atropine  1 tablet Oral TID AC  . escitalopram  20 mg Oral Daily  . famotidine  10 mg Oral QHS  . levothyroxine  125 mcg Oral QAC breakfast  . liver oil-zinc oxide   Topical TID  . ondansetron  8 mg Intravenous Q6H  . pantoprazole  40 mg Oral BID  . sodium chloride  500 mL Intravenous Once   Continuous Infusions: . sodium chloride 100 mL/hr at 10/19/14 2025   PRN Meds:.acetaminophen, HYDROmorphone (DILAUDID) injection, LORazepam, promethazine ASSESMENT:   * Epigastric pain, N/V. Chronic diarrhea but more recently streaks of blood.  Elevated Lipase, alk phos and AST/ALT: resolved or improving. No evidence pancreatitis on CT. Pancreas obscured on ultrasound.  Hx  diminutive adenoma 01/2013.  Colonoscopy 11/06/2014: rectal polyp removed, o/w normal to TI. Random biopsies:  Benign, no microscopic colitis.   EGD 11/09/2014: benign appearing gastric polyps, patchy gastritis (erythema, subepithelial heme, gastritis. Path: reactive gastropathy.  Was taking 600 mg Ibuprofen 3 to 4 times per day for headaches and later for LUQ pain that she developed after lot of vigorous retching.  Dr Carlean Purl suspects functional GI issues. On Lomotil TID. BID IV Protonix, HS oral pepcid. Colestipol added 5/10.  * Splenomegaly, stable steatosis, stable/chronic right intrarenal dilation per CT scan.    *  Depression, anxiety.   PLAN   *   Given that the patient's diarrhea has been extensively worked up and has been in existence for at least 8 years, no plans for further studies during this hospitalization. Hospital follow-up arranged for May 27 with GI physician assistant.. GI is signing off. In addition to possibly initiating Alosetron or Viberzi, wonder about possible trial of pancreatic enzyme replacement?   *  Change Protonix back to once daily (was taking the Omeprazole just once daily, not the BID as noted in intake record). PPIs can lead to diarrhea. Also stopped the HS Famotidine.   *  Avoid NSAIDs.  Pt aware of this.    Azucena Freed  10/20/2014, 9:34 AM Pager: [phone]  [facility] Attending  I have also seen and assessed the patient and agree with the advanced practitioner's assessment and plan.  She ha GPC bacteremia which explains new issues.  If diarrhea is not improving would increase colestipol to 5 g bid lunch and supper.  Would not do pancreatic enzyme for her issues.  Am around so call if needed  Gatha Mayer, MD, [facility] [phone] (pager) 10/20/2014 4:35 PM

## 2014-10-20 NOTE — Progress Notes (Signed)
PROGRESS NOTE    Madeline Dyer UYQ:[id number] DOB: 1950/06/19 DOA: 10/28/2014 PCP: Tammi Sou, MD  Primary Gastroenterologist: Dr. Silvano Rusk Primary Urologist: Dr. Risa Grill.  HPI/Brief narrative 64 year old female with history of GERD, PUD, IBS, status post cholecystectomy, fibromyalgia, HLD, hypothyroid, status post bioprosthetic aortic valve replacement, right hydronephrosis status post stent, fatty liver, chronic diarrhea, presented to [facility] ED on 10/14/13 with approximately 4 week history of epigastric abdominal pain, intermittent daily nonbloody emesis or retching, poor appetite, 5-6 pounds weight loss, ongoing chronic diarrhea but with streaks of blood and? Occasional black stools and generalized weakness. She attempted to see GI outpatient but no early appointments were available. Admitted for further evaluation and management. [facility] consulted.   Assessment/Plan:  Nausea, vomiting, abdominal pain - Initially felt to be secondary to NSAID-induced gastritis or PUD -GI was consulted and managed conservatively. Despite this she continued to be symptomatic and had some blood in stools and dropped hemoglobin by approximately 2 g. She then underwent EGD and colonoscopy, EGD showing patchy gastritis - GI feels her presentation is mostly from IBS and are making medication changes.  - H pylori IgG: Negative - Urine microscopy not impressive for UTI. - Left-sided abdominal pain may be muscular in etiology from dry heaving. Pain better. - Had a couple of episodes of nonbloody emesis and lots of dry heaves overnight.   Chronic diarrhea with blood streaks - As per GI, likely related to IBS - Started Imodium. Monitor - Rotavirus: Not detected - C. difficile PCR negative. - Giardia screen: Negative - Cryptosporidium screen: Negative - Stool culture negative but showed decreased normal flora. We'll start florastor. - Please see above  Low-grade fevers - No clinical focus of  sepsis - Urine microscopy 5/6 , and repeat on 5/10 with no evidence of UTI. - Chest x-ray was negative for acute findings - Follow on repeat blood culture 10/19/14 - Check venous Doppler to rule out DVT  Acute blood loss anemia - As indicated above, patient has dropped hemoglobin by approximately 2 g since admission which may be a combination of dilutional and blood loss from GI bleed. - Stable.  Dehydration with hyponatremia - Secondary to GI losses - Improved after IV hydration  Hypokalemia - Replace as needed. Improved  Hypothyroid - Continue levothyroxin - TSH: 7.41. May be related to poor absorption in the context of GI issues. - Clinically euthyroid. - Follow TSH in 4-6 weeks.  Hyperlipidemia - Continue statins  Non-anion gap metabolic acidosis - Secondary to GI losses - Resolved  Status post bioprosthetic aortic valve replacement - Patient on atenolol 25 MG daily-unclear indication-? HTN - Soft blood pressures may be related to intravascular volume depletion - Holding parameters for atenolol - Hydrate with IV fluids and monitor closely.  Hypotension - Not entirely clear why she is on atenolol. Had symptomatic low blood pressure with systolic in the 63O. DC atenolol. IV normal saline bolus and maintenance fluid. Follow clinically  Night sweats - Patient states that she's been having night sweats regularly for years dating back to 2011. Denies cough. Etiology unclear.  Low-grade fevers - No clinical focus of sepsis - Urine microscopy 5/6 was not impressive for UTI - Chest x-ray was negative for acute findings - Will repeat chest x-ray, urine microscopy and a set of blood cultures - May need to start antibiotics if she spikes fever greater than 10 9F     Code Status: Full Family Communication: None at bedside Disposition Plan: DC home when medically stable-possibly  in the next 48 hours.   Consultants:  [facility]  Procedures: Colonoscopy 10/20/2014: rectal  polyp removed, o/w normal to TI. Random biopsies to r/o microscopic colitis.  EGD 11/08/2014: benign appearing gastric polyps, patchy gastritis (erythema, subepithelial heme, gastritis. Biopsies obtained and pending.  Antibiotics:  None   Subjective: No nausea or vomiting since yesterday. Diarrhea unchanged but no blood in it. Complaints of chronic soaking sweats at night. Low-grade fevers. No cough, dyspnea, dysuria or urinary frequency.   Objective: Filed Vitals:   10/19/14 1433 10/19/14 1457 10/19/14 2051 10/20/14 0600  BP: 90/60 83/54 91/63  109/73  Pulse: 82 92 87 96  Temp:  100.7 F (38.2 C) 97.9 F (36.6 C) 100.5 F (38.1 C)  TempSrc:   Oral   Resp:  20 19 18   Height:      Weight:      SpO2:  93% 91% 97%    Intake/Output Summary (Last 24 hours) at 10/20/14 1159 Last data filed at 10/20/14 0900  Gross per 24 hour  Intake    780 ml  Output    250 ml  Net    530 ml   Filed Weights   10/16/14 0144  Weight: 88.8 kg (195 lb 12.3 oz)     Exam:  General exam: Pleasant middle-aged female seen ambulating comfortably in the room. Oral mucosa with borderline hydration. Respiratory system: Clear. No increased work of breathing. Cardiovascular system: S1 & S2 heard, RRR. No JVD, murmurs, gallops, clicks or pedal edema. Gastrointestinal system: Abdomen is nondistended, soft and nontender. Normal bowel sounds heard. Central nervous system: Alert and oriented. No focal neurological deficits. Extremities: Symmetric 5 x 5 power. Minimal pedal edema and left lower extremity.   Data Reviewed: Basic Metabolic Panel:  Recent Labs Lab 10/16/2014 2233 10/16/14 0510 10/16/14 1630 10/17/14 0452 10/26/2014 0451 10/19/14 0600  NA  --  132* 133* 134* 135 137  K  --  2.9* 3.4* 4.1 3.8 3.7  CL  --  101 102 104 102 101  CO2  --  21* 20* 23 24 24   GLUCOSE  --  110* 110* 103* 93 80  BUN  --  6 <5* <5* <5* 5*  CREATININE  --  0.78 0.78 0.80 0.88 0.83  CALCIUM  --  8.3* 8.3* 8.0* 8.4*  8.2*  MG 1.5*  --   --   --   --   --    Liver Function Tests:  Recent Labs Lab 10/14/14 1622 11/07/2014 2105 10/17/14 0452  AST 54* 51* 31  ALT 50* 53 33  ALKPHOS 248* 223* 195*  BILITOT 1.1 1.7* 1.4*  PROT 6.8 6.8 5.4*  ALBUMIN 3.1* 2.7* 2.0*    Recent Labs Lab 10/14/14 1622 11/09/2014 2105  LIPASE 158.0* 62*  AMYLASE 96  --    No results for input(s): AMMONIA in the last 168 hours. CBC:  Recent Labs Lab 10/14/14 1622 11/02/2014 2105  10/16/14 1630 10/17/14 0452 10/17/14 1651 10/17/2014 0451 10/19/14 0600  WBC 13.3* 12.7*  < > 13.1* 11.6* 12.7* 14.2* 11.6*  NEUTROABS 10.8* 10.1*  --   --   --   --   --   --   HGB 12.1 11.5*  < > 10.4* 9.8* 10.1* 10.2* 10.1*  HCT 36.0 33.4*  < > 30.9* 29.7* 30.5* 30.9* 31.2*  MCV 86.0 84.1  < > 86.6 86.6 88.2 87.5 87.2  PLT 298.0 266  < > 238 255 260 243 231  < > = values in  this interval not displayed. Cardiac Enzymes: No results for input(s): CKTOTAL, CKMB, CKMBINDEX, TROPONINI in the last 168 hours. BNP (last 3 results) No results for input(s): PROBNP in the last 8760 hours. CBG: No results for input(s): GLUCAP in the last 168 hours.  Recent Results (from the past 240 hour(s))  Stool culture     Status: None   Collection Time: 10/14/14  3:52 PM  Result Value Ref Range Status   Organism ID, Bacteria No Salmonella,Shigella,Campylobacter,Yersinia,or  Final   Organism ID, Bacteria No E.coli [id number]:H7 isolated.  Final    Comment: Reduced Normal Flora present  Giardia/cryptosporidium (EIA)     Status: None   Collection Time: 10/14/14  3:52 PM  Result Value Ref Range Status   Giardia Screen (EIA) NEGATIVE  Final   Cryptosporidium Screen (EIA) NEGATIVE  Final  Clostridium Difficile by PCR     Status: None   Collection Time: 10/14/14  3:52 PM  Result Value Ref Range Status   C difficile by pcr Not Detected Not Detected Final    Comment: This test is for use only with liquid or soft stools; performance characteristics of other  clinical specimen types have not been established.   This assay was performed by Cepheid GeneXpert(R) PCR. The performance characteristics of this assay have been determined by [facility]. Performance characteristics refer to the analytical performance of the test.   Stool culture     Status: None (Preliminary result)   Collection Time: 10/17/14  1:23 AM  Result Value Ref Range Status   Specimen Description STOOL  Final   Special Requests NONE  Final   Culture   Final    NO SUSPICIOUS COLONIES, CONTINUING TO HOLD Note: REDUCED NORMAL FLORA PRESENT Performed at [facility]    Report Status PENDING  Incomplete  Culture, blood (routine x 2)     Status: None (Preliminary result)   Collection Time: 10/19/14  8:25 PM  Result Value Ref Range Status   Specimen Description BLOOD LEFT HAND  Final   Special Requests BOTTLES DRAWN AEROBIC AND ANAEROBIC 10ML EACH  Final   Culture   Final    GRAM POSITIVE COCCI IN PAIRS Note: Gram Stain Report Called to,Read Back By and Verified With: ANTOINETTE F BY INGRAM A 10/20/14 1148AM Performed at [facility]    Report Status PENDING  Incomplete  Culture, blood (routine x 2)     Status: None (Preliminary result)   Collection Time: 10/19/14  8:32 PM  Result Value Ref Range Status   Specimen Description BLOOD LEFT HAND  Final   Special Requests   Final    BOTTLES DRAWN AEROBIC AND ANAEROBIC 10MLBLUE 8ML RED   Culture   Final    GRAM POSITIVE COCCI IN PAIRS Note: Gram Stain Report Called to,Read Back By and Verified With: ANTOINETTE F BY INGRAM A 10/20/14 1148AM Performed at [facility]    Report Status PENDING  Incomplete        Studies: Dg Chest 2 View  10/19/2014   CLINICAL DATA:  Mid chest pain under left breast with shortness of breath. Fever since 09/26/2014. Prior aortic valve replacement.  EXAM: CHEST  2 VIEW  COMPARISON:  10/14/2014 acute abdomen series.  FINDINGS: Prior median sternotomy. Lateral  view degraded by patient arm position. Midline trachea. Mild cardiomegaly. No pleural effusion or pneumothorax. No congestive failure. Mild left base scarring.  IMPRESSION: Cardiomegaly, without congestive failure.  No explanation for fever.   Electronically Signed  By: Abigail Miyamoto M.D.   On: 10/19/2014 20:13        Scheduled Meds: . atorvastatin  20 mg Oral QPM  . colestipol  5 g Oral Q supper  . diclofenac sodium  2 g Topical QID  . diphenoxylate-atropine  1 tablet Oral TID AC  . escitalopram  20 mg Oral Daily  . levothyroxine  125 mcg Oral QAC breakfast  . liver oil-zinc oxide   Topical TID  . ondansetron  8 mg Intravenous Q6H  . [START ON 10/21/2014] pantoprazole  40 mg Oral Daily  . sodium chloride  500 mL Intravenous Once   Continuous Infusions:    Principal Problem:   Nausea vomiting and diarrhea Active Problems:   Metabolic acidosis, NAG, bicarbonate losses   Dehydration with hyponatremia   Hypokalemia due to loss of potassium   Hypokalemia   Acute posthemorrhagic anemia   Multiple gastric polyps   Gastritis and gastroduodenitis   Arterial hypotension   Pyrexia    Time spent: 30 minutes    Duval Macleod, MD [facility] Pager [phone]  If 7PM-7AM, please contact night-coverage [email or web address] Password [email or web address] 10/20/2014, 11:59 AM    LOS: 5 days

## 2014-10-20 NOTE — Progress Notes (Signed)
   See other note also....  She ha GPC bacteremia which explains new issues.  If diarrhea is not improving would increase colestipol to 5 g bid lunch and supper.  Would not do pancreatic enzyme for her issues.  Am around so call if needed  Gatha Mayer, MD, [facility] [phone] (pager)

## 2014-10-21 DIAGNOSIS — K589 Irritable bowel syndrome without diarrhea: Secondary | ICD-10-CM

## 2014-10-21 DIAGNOSIS — B952 Enterococcus as the cause of diseases classified elsewhere: Secondary | ICD-10-CM

## 2014-10-21 DIAGNOSIS — Z954 Presence of other heart-valve replacement: Secondary | ICD-10-CM

## 2014-10-21 LAB — STOOL CULTURE

## 2014-10-21 LAB — URINE CULTURE

## 2014-10-21 LAB — BASIC METABOLIC PANEL
Anion gap: 9 (ref 5–15)
BUN: <5 mg/dL — ABNORMAL LOW (ref 6–20)
CALCIUM: 7.9 mg/dL — AB (ref 8.9–10.3)
CO2: 26 mmol/L (ref 22–32)
CREATININE: 0.81 mg/dL (ref 0.44–1.00)
Chloride: 101 mmol/L (ref 101–111)
GFR calc Af Amer: >60 mL/min (ref >60)
GLUCOSE: 143 mg/dL — AB (ref 65–99)
Potassium: 2.9 mmol/L — ABNORMAL LOW (ref 3.5–5.1)
Sodium: 136 mmol/L (ref 135–145)

## 2014-10-21 LAB — CBC
HCT: 28.5 % — ABNORMAL LOW (ref 36.0–46.0)
Hemoglobin: 9.4 g/dL — ABNORMAL LOW (ref 12.0–15.0)
MCH: 28.4 pg (ref 26.0–34.0)
MCHC: 33 g/dL (ref 30.0–36.0)
MCV: 86.1 fL (ref 78.0–100.0)
PLATELETS: 223 10*3/uL (ref 150–400)
RBC: 3.31 MIL/uL — ABNORMAL LOW (ref 3.87–5.11)
RDW: 14.4 % (ref 11.5–15.5)
WBC: 12.6 10*3/uL — ABNORMAL HIGH (ref 4.0–10.5)

## 2014-10-21 MED ORDER — POTASSIUM CHLORIDE CRYS ER 20 MEQ PO TBCR
40.0000 meq | EXTENDED_RELEASE_TABLET | ORAL | Status: AC
  Administered 2014-10-21 (×3): 40 meq via ORAL
  Filled 2014-10-21 (×3): qty 2

## 2014-10-21 MED ORDER — SODIUM CHLORIDE 0.9 % IV SOLN
INTRAVENOUS | Status: DC
  Administered 2014-10-21: 20:00:00 via INTRAVENOUS
  Administered 2014-10-22: 500 mL via INTRAVENOUS

## 2014-10-21 NOTE — Progress Notes (Signed)
    [facility] has been requested to perform a transesophageal echocardiogram on 10/21/2014 for enterococcal bacteremia.  After careful review of history and examination, the risks and benefits of transesophageal echocardiogram have been explained including risks of esophageal damage, perforation (1:10,000 risk), bleeding, pharyngeal hematoma as well as other potential complications associated with conscious sedation including aspiration, arrhythmia, respiratory failure and death. Alternatives to treatment were discussed, questions were answered. Patient is willing to proceed.   Almyra Deforest, [place] 10/21/2014 6:15 PM
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
## 2014-10-21 NOTE — Progress Notes (Signed)
PROGRESS NOTE    Madeline Dyer JXB:[id number] DOB: 04/20/1951 DOA: 11/09/2014 PCP: Tammi Sou, MD  Primary Gastroenterologist: Dr. Silvano Rusk Primary Urologist: Dr. Risa Grill.  HPI/Brief narrative 64 year old female with history of GERD, PUD, IBS, status post cholecystectomy, fibromyalgia, HLD, hypothyroid, status post bioprosthetic aortic valve replacement, right hydronephrosis status post stent, fatty liver, chronic diarrhea, presented to [facility] ED on 10/14/13 with approximately 4 week history of epigastric abdominal pain, intermittent daily nonbloody emesis or retching, poor appetite, 5-6 pounds weight loss, ongoing chronic diarrhea but with streaks of blood and? Occasional black stools and generalized weakness. She attempted to see GI outpatient but no early appointments were available. Admitted for further evaluation and management. [facility] consulted.   Assessment/Plan:   enterococcus bacteremia - Patient had low-grade fever, so septic workup was done including negative urinalysis 2, negative chest x-ray 2, venous Dopplers negative for DVT, findings were significant for bacteremia growing enterococcus(has low-grade fever, and night sweats) - Patient was started on IV vancomycin 5/11. - Infectious disease service consulted.  Nausea, vomiting, abdominal pain - Initially felt to be secondary to NSAID-induced gastritis or PUD -GI was consulted and managed conservatively. Despite this she continued to be symptomatic and had some blood in stools and dropped hemoglobin by approximately 2 g. She then underwent EGD and colonoscopy, EGD showing patchy gastritis - GI feels her presentation is mostly from IBS and are making medication changes.  - H pylori IgG: Negative  Chronic diarrhea with blood streaks - As per GI, likely related to IBS - Started on Imodium. Monitor - Rotavirus: Not detected - C. difficile PCR negative. - Giardia screen: Negative - Cryptosporidium screen:  Negative - Stool culture negative but showed decreased normal flora. started on  florastor. - On colestipol  Low-grade fevers -  Please see above discussion regarding enterococcus bacteremia  Acute blood loss anemia - As indicated above, patient has dropped hemoglobin by approximately 2 g since admission which may be a combination of dilutional and blood loss from GI bleed. - Stable.   Dehydration with hyponatremia - Secondary to GI losses - Improved after IV hydration  Hypokalemia - Repleted, recheck in a.m.  Hypothyroid - Continue levothyroxin - TSH: 7.41. May be related to poor absorption in the context of GI issues. - Clinically euthyroid. - Follow TSH in 4-6 weeks.  Hyperlipidemia - Continue statins  Non-anion gap metabolic acidosis - Secondary to GI losses - Resolved  Status post bioprosthetic aortic valve replacement - Patient on atenolol 25 MG daily-unclear indication-? HTN - Soft blood pressures may be related to intravascular volume depletion - Holding parameters for atenolol - Hydrate with IV fluids and monitor closely. - Requested infectious disease consult regarding patient enterococcus bacteremia  Hypotension - Not entirely clear why she is on atenolol. Had symptomatic low blood pressure with systolic in the 13Y. DC atenolol. IV normal saline bolus and maintenance fluid. Follow clinically  Night sweats - Patient states that she's been having night sweats regularly for years dating back to 2011. Denies cough.     Code Status: Full Family Communication: None at bedside Disposition Plan: DC home when medically stable, PT consult pending   Consultants:  [facility]  Infectious disease  Procedures: Colonoscopy 10/27/2014: rectal polyp removed, o/w normal to TI. Random biopsies to r/o microscopic colitis.  EGD 11/09/2014: benign appearing gastric polyps, patchy gastritis (erythema, subepithelial heme, gastritis. Biopsies obtained and  pending.  Antibiotics:  None   Subjective: No nausea or vomiting since yesterday. No further diarrhea.  Complaints of chronic soaking sweats at night. Low-grade fevers. No cough, dyspnea, dysuria or urinary frequency.   Objective: Filed Vitals:   10/20/14 0600 10/20/14 1354 10/20/14 2129 10/21/14 0509  BP: 109/73 84/61 97/70  100/68  Pulse: 96 81 94 99  Temp: 100.5 F (38.1 C) 98.2 F (36.8 C) 98.2 F (36.8 C) 100 F (37.8 C)  TempSrc:  Oral Oral Axillary  Resp: 18 18 18 19   Height:      Weight:      SpO2: 97% 93% 94% 91%    Intake/Output Summary (Last 24 hours) at 10/21/14 1130 Last data filed at 10/21/14 0600  Gross per 24 hour  Intake 1453.75 ml  Output    300 ml  Net 1153.75 ml   Filed Weights   10/16/14 0144  Weight: 88.8 kg (195 lb 12.3 oz)     Exam:  General exam: Pleasant middle-aged female seen ambulating comfortably in the room. Oral mucosa with borderline hydration. Respiratory system: Clear. No increased work of breathing. Cardiovascular system: S1 & S2 heard, RRR. No JVD, has SEM murmurs. Gastrointestinal system: Abdomen is nondistended, soft and nontender. Normal bowel sounds heard. Central nervous system: Alert and oriented. No focal neurological deficits. Extremities: Symmetric 5 x 5 power. Minimal pedal edema and left lower extremity.   Data Reviewed: Basic Metabolic Panel:  Recent Labs Lab 10/28/2014 2233  10/16/14 1630 10/17/14 0452 10/28/2014 0451 10/19/14 0600 10/21/14 0457  NA  --   < > 133* 134* 135 137 136  K  --   < > 3.4* 4.1 3.8 3.7 2.9*  CL  --   < > 102 104 102 101 101  CO2  --   < > 20* 23 24 24 26   GLUCOSE  --   < > 110* 103* 93 80 143*  BUN  --   < > <5* <5* <5* 5* <5*  CREATININE  --   < > 0.78 0.80 0.88 0.83 0.81  CALCIUM  --   < > 8.3* 8.0* 8.4* 8.2* 7.9*  MG 1.5*  --   --   --   --   --   --   < > = values in this interval not displayed. Liver Function Tests:  Recent Labs Lab 10/14/14 1622 10/29/2014 2105  10/17/14 0452  AST 54* 51* 31  ALT 50* 53 33  ALKPHOS 248* 223* 195*  BILITOT 1.1 1.7* 1.4*  PROT 6.8 6.8 5.4*  ALBUMIN 3.1* 2.7* 2.0*    Recent Labs Lab 10/14/14 1622 11/02/2014 2105  LIPASE 158.0* 62*  AMYLASE 96  --    No results for input(s): AMMONIA in the last 168 hours. CBC:  Recent Labs Lab 10/14/14 1622 11/07/2014 2105  10/17/14 0452 10/17/14 1651 10/25/2014 0451 10/19/14 0600 10/21/14 0457  WBC 13.3* 12.7*  < > 11.6* 12.7* 14.2* 11.6* 12.6*  NEUTROABS 10.8* 10.1*  --   --   --   --   --   --   HGB 12.1 11.5*  < > 9.8* 10.1* 10.2* 10.1* 9.4*  HCT 36.0 33.4*  < > 29.7* 30.5* 30.9* 31.2* 28.5*  MCV 86.0 84.1  < > 86.6 88.2 87.5 87.2 86.1  PLT 298.0 266  < > 255 260 243 231 223  < > = values in this interval not displayed. Cardiac Enzymes: No results for input(s): CKTOTAL, CKMB, CKMBINDEX, TROPONINI in the last 168 hours. BNP (last 3 results) No results for input(s): PROBNP in the last 8760 hours. CBG: No results  for input(s): GLUCAP in the last 168 hours.  Recent Results (from the past 240 hour(s))  Stool culture     Status: None   Collection Time: 10/14/14  3:52 PM  Result Value Ref Range Status   Organism ID, Bacteria No Salmonella,Shigella,Campylobacter,Yersinia,or  Final   Organism ID, Bacteria No E.coli 0157:H7 isolated.  Final    Comment: Reduced Normal Flora present  Giardia/cryptosporidium (EIA)     Status: None   Collection Time: 10/14/14  3:52 PM  Result Value Ref Range Status   Giardia Screen (EIA) NEGATIVE  Final   Cryptosporidium Screen (EIA) NEGATIVE  Final  Clostridium Difficile by PCR     Status: None   Collection Time: 10/14/14  3:52 PM  Result Value Ref Range Status   C difficile by pcr Not Detected Not Detected Final    Comment: This test is for use only with liquid or soft stools; performance characteristics of other clinical specimen types have not been established.   This assay was performed by Cepheid GeneXpert(R) PCR. The  performance characteristics of this assay have been determined by [facility]. Performance characteristics refer to the analytical performance of the test.   Stool culture     Status: None   Collection Time: 10/17/14  1:23 AM  Result Value Ref Range Status   Specimen Description STOOL  Final   Special Requests NONE  Final   Culture   Final    NO SALMONELLA, SHIGELLA, CAMPYLOBACTER, YERSINIA, OR E.COLI 0157:H7 ISOLATED Note: REDUCED NORMAL FLORA PRESENT Performed at [facility]    Report Status 10/21/2014 FINAL  Final  Culture, blood (routine x 2)     Status: None (Preliminary result)   Collection Time: 10/19/14  8:25 PM  Result Value Ref Range Status   Specimen Description BLOOD LEFT HAND  Final   Special Requests BOTTLES DRAWN AEROBIC AND ANAEROBIC 10ML EACH  Final   Culture   Final    ENTEROCOCCUS SPECIES Note: Gram Stain Report Called to,Read Back By and Verified With: ANTOINETTE F BY INGRAM A 10/20/14 1148AM Performed at [facility]    Report Status PENDING  Incomplete  Culture, blood (routine x 2)     Status: None (Preliminary result)   Collection Time: 10/19/14  8:32 PM  Result Value Ref Range Status   Specimen Description BLOOD LEFT HAND  Final   Special Requests   Final    BOTTLES DRAWN AEROBIC AND ANAEROBIC 10MLBLUE 8ML RED   Culture   Final    ENTEROCOCCUS SPECIES Note: Gram Stain Report Called to,Read Back By and Verified With: ANTOINETTE F BY INGRAM A 10/20/14 1148AM Performed at [facility]    Report Status PENDING  Incomplete  Culture, Urine     Status: None   Collection Time: 10/20/14  4:10 AM  Result Value Ref Range Status   Specimen Description URINE, CLEAN CATCH  Final   Special Requests NONE  Final   Colony Count   Final    20,OOO COLONIES/ML Performed at [employer] [facility]   Final    Multiple bacterial morphotypes present, none predominant. Suggest appropriate recollection if clinically  indicated. Performed at [facility]    Report Status 10/21/2014 FINAL  Final        Studies: Dg Chest 2 View  10/19/2014   CLINICAL DATA:  Mid chest pain under left breast with shortness of breath. Fever since 09/26/2014. Prior aortic valve replacement.  EXAM: CHEST  2  VIEW  COMPARISON:  10/14/2014 acute abdomen series.  FINDINGS: Prior median sternotomy. Lateral view degraded by patient arm position. Midline trachea. Mild cardiomegaly. No pleural effusion or pneumothorax. No congestive failure. Mild left base scarring.  IMPRESSION: Cardiomegaly, without congestive failure.  No explanation for fever.   Electronically Signed   By: Abigail Miyamoto M.D.   On: 10/19/2014 20:13        Scheduled Meds: . atorvastatin  20 mg Oral QPM  . colestipol  5 g Oral Q supper  . diclofenac sodium  2 g Topical QID  . diphenoxylate-atropine  1 tablet Oral TID AC  . escitalopram  20 mg Oral Daily  . levothyroxine  125 mcg Oral QAC breakfast  . liver oil-zinc oxide   Topical TID  . ondansetron  8 mg Intravenous Q6H  . pantoprazole  40 mg Oral Daily  . potassium chloride  40 mEq Oral Q4H  . sodium chloride  500 mL Intravenous Once  . vancomycin  1,000 mg Intravenous Q12H   Continuous Infusions: . sodium chloride 75 mL/hr at 10/21/14 0600    Principal Problem:   Nausea vomiting and diarrhea Active Problems:   Metabolic acidosis, NAG, bicarbonate losses   Dehydration with hyponatremia   Hypokalemia due to loss of potassium   Hypokalemia   Acute posthemorrhagic anemia   Multiple gastric polyps   Gastritis and gastroduodenitis   Arterial hypotension   Pyrexia    Time spent: 30 minutes    Caidin Heidenreich, MD [facility] Pager [phone]  If 7PM-7AM, please contact night-coverage [email or web address] Password [email or web address] 10/21/2014, 11:30 AM    LOS: 6 days

## 2014-10-21 NOTE — Progress Notes (Signed)
PT Cancellation Note  Patient Details Name: Madeline Dyer MRN: [id number] DOB: [birth year]   Cancelled Treatment:    Reason Eval/Treat Not Completed: Patient declined, no reason specified Pt requests to take a nap and not up for participating with therapy this PM. Will follow up next available time.   Marguarite Arbour A Meilani Edmundson 10/21/2014, 3:03 PM

## 2014-10-22 ENCOUNTER — Inpatient Hospital Stay (HOSPITAL_COMMUNITY): Payer: 59

## 2014-10-22 ENCOUNTER — Encounter (HOSPITAL_COMMUNITY): Payer: Self-pay | Admitting: *Deleted

## 2014-10-22 ENCOUNTER — Encounter (HOSPITAL_COMMUNITY): Admission: EM | Disposition: E | Payer: Self-pay | Source: Home / Self Care | Attending: Internal Medicine

## 2014-10-22 DIAGNOSIS — B952 Enterococcus as the cause of diseases classified elsewhere: Secondary | ICD-10-CM | POA: Insufficient documentation

## 2014-10-22 DIAGNOSIS — T826XXA Infection and inflammatory reaction due to cardiac valve prosthesis, initial encounter: Secondary | ICD-10-CM

## 2014-10-22 DIAGNOSIS — R7881 Bacteremia: Secondary | ICD-10-CM | POA: Insufficient documentation

## 2014-10-22 DIAGNOSIS — Y838 Other surgical procedures as the cause of abnormal reaction of the patient, or of later complication, without mention of misadventure at the time of the procedure: Secondary | ICD-10-CM

## 2014-10-22 DIAGNOSIS — I340 Nonrheumatic mitral (valve) insufficiency: Secondary | ICD-10-CM

## 2014-10-22 DIAGNOSIS — I38 Endocarditis, valve unspecified: Secondary | ICD-10-CM

## 2014-10-22 HISTORY — PX: TEE WITHOUT CARDIOVERSION: SHX5443

## 2014-10-22 LAB — CBC
HCT: 28.6 % — ABNORMAL LOW (ref 36.0–46.0)
HEMOGLOBIN: 9.2 g/dL — AB (ref 12.0–15.0)
MCH: 27.9 pg (ref 26.0–34.0)
MCHC: 32.2 g/dL (ref 30.0–36.0)
MCV: 86.7 fL (ref 78.0–100.0)
Platelets: 214 10*3/uL (ref 150–400)
RBC: 3.3 MIL/uL — ABNORMAL LOW (ref 3.87–5.11)
RDW: 14.6 % (ref 11.5–15.5)
WBC: 10.8 10*3/uL — AB (ref 4.0–10.5)

## 2014-10-22 LAB — HIV ANTIBODY (ROUTINE TESTING W REFLEX): HIV SCREEN 4TH GENERATION: NONREACTIVE

## 2014-10-22 LAB — BASIC METABOLIC PANEL
Anion gap: 11 (ref 5–15)
BUN: <5 mg/dL — ABNORMAL LOW (ref 6–20)
CALCIUM: 7.9 mg/dL — AB (ref 8.9–10.3)
CO2: 25 mmol/L (ref 22–32)
CREATININE: 0.88 mg/dL (ref 0.44–1.00)
Chloride: 99 mmol/L — ABNORMAL LOW (ref 101–111)
Glucose, Bld: 92 mg/dL (ref 65–99)
Potassium: 3.6 mmol/L (ref 3.5–5.1)
SODIUM: 135 mmol/L (ref 135–145)

## 2014-10-22 LAB — CULTURE, BLOOD (ROUTINE X 2)

## 2014-10-22 SURGERY — ECHOCARDIOGRAM, TRANSESOPHAGEAL
Anesthesia: Moderate Sedation

## 2014-10-22 MED ORDER — FENTANYL CITRATE (PF) 100 MCG/2ML IJ SOLN
INTRAMUSCULAR | Status: AC
  Filled 2014-10-22: qty 2

## 2014-10-22 MED ORDER — LIDOCAINE VISCOUS 2 % MT SOLN
OROMUCOSAL | Status: AC
  Filled 2014-10-22: qty 15

## 2014-10-22 MED ORDER — FENTANYL CITRATE (PF) 100 MCG/2ML IJ SOLN
INTRAMUSCULAR | Status: DC | PRN
  Administered 2014-10-22: 25 ug via INTRAVENOUS

## 2014-10-22 MED ORDER — DIPHENHYDRAMINE HCL 50 MG/ML IJ SOLN
INTRAMUSCULAR | Status: AC
  Filled 2014-10-22: qty 1

## 2014-10-22 MED ORDER — AMPICILLIN SODIUM 2 G IJ SOLR
2.0000 g | INTRAMUSCULAR | Status: DC
  Administered 2014-10-22 (×3): 2 g via INTRAVENOUS
  Filled 2014-10-22 (×8): qty 2000

## 2014-10-22 MED ORDER — GENTAMICIN SULFATE 40 MG/ML IJ SOLN
90.0000 mg | Freq: Three times a day (TID) | INTRAVENOUS | Status: DC
  Administered 2014-10-22 – 2014-10-23 (×2): 90 mg via INTRAVENOUS
  Filled 2014-10-22 (×3): qty 2.25

## 2014-10-22 MED ORDER — MIDAZOLAM HCL 10 MG/2ML IJ SOLN
INTRAMUSCULAR | Status: DC | PRN
  Administered 2014-10-22 (×2): 2 mg via INTRAVENOUS

## 2014-10-22 MED ORDER — LIDOCAINE VISCOUS 2 % MT SOLN
OROMUCOSAL | Status: DC | PRN
  Administered 2014-10-22: 10 mL via OROMUCOSAL

## 2014-10-22 MED ORDER — MIDAZOLAM HCL 5 MG/ML IJ SOLN
INTRAMUSCULAR | Status: AC
  Filled 2014-10-22: qty 2

## 2014-10-22 NOTE — Op Note (Signed)
Full report in CV section of chart 
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
## 2014-10-22 NOTE — Interval H&P Note (Signed)
History and Physical Interval Note:  10/16/2014 2:00 PM  Madeline Dyer  has presented today for surgery, with the diagnosis of RULE OUT ENDOCARDITIS  The various methods of treatment have been discussed with the patient and family. After consideration of risks, benefits and other options for treatment, the patient has consented to  Procedure(s): TRANSESOPHAGEAL ECHOCARDIOGRAM (TEE) (N/A) as a surgical intervention .  The patient's history has been reviewed, patient examined, no change in status, stable for surgery.  I have reviewed the patient's chart and labs.  Questions were answered to the patient's satisfaction.     [name]

## 2014-10-22 NOTE — Evaluation (Signed)
Physical Therapy Evaluation Patient Details Name: Madeline Dyer MRN: [id number] DOB: 1951-03-06 Today's Date: 10/14/2014   History of Present Illness  Patient is a 64 y/o female admitted for N/V, diarrhea onset 4/18 worsened since then. EGD showing patchy gastritis. + for bacteremia growing enterococcus. Plan for TEE 5/13. PMH of GERD, PUD, IBS, s/p cholecystectomy, fibromyalgia, HLD, hypothyroid, s/p bioprosthetic AVR, right hydronephrosis s/p stent, fatty liver, chronic diarrhea, depression with anxiety.     Clinical Impression  Patient presents with confusion, impaired safety awareness (see below for details) and impaired balance impacting safe mobility. Pt is home alone during the day. Pt given ativan earlier in Am. Will need to reassess mobility once mentation improves. Would benefit from skilled PT to improve overall functional mobility prior to return home to ensure pt safe to return home.    Follow Up Recommendations Home health PT;Supervision/Assistance - 24 hour pending improvement    Equipment Recommendations  Other (comment) (TBD)    Recommendations for Other Services       Precautions / Restrictions Precautions Precautions: Fall Restrictions Weight Bearing Restrictions: No      Mobility  Bed Mobility               General bed mobility comments: Sitting on BSC upon PT arrival with IV stretched to capacity and still plugged into wall, "I think I need help." Floor is saturated in urine.  Transfers Overall transfer level: Needs assistance Equipment used: None Transfers: Sit to/from Stand Sit to Stand: Min assist         General transfer comment: Min A to rise from [facility] and from EOB x1. SPT to [facility] Min A for balance/support. Unsteady and lethargic. Multiple attempts to clear bottom from EOB. + incontinent of stool resulting in need to transfer to [facility] second time.  Ambulation/Gait Ambulation/Gait assistance: Min assist Ambulation Distance (Feet): 15  Feet Assistive device: 1 person hand held assist Gait Pattern/deviations: Step-through pattern;Decreased stride length;Staggering right;Staggering left   Gait velocity interpretation: Below normal speed for age/gender General Gait Details: Pt with unsteady gait staggering right/eft. Very lethargic with eyes closed. Confused. Deep, loud breathing.  Stairs            Wheelchair Mobility    Modified Rankin (Stroke Patients Only)       Balance Overall balance assessment: Needs assistance Sitting-balance support: Feet supported;No upper extremity supported Sitting balance-Leahy Scale: Fair     Standing balance support: During functional activity Standing balance-Leahy Scale: Fair Standing balance comment: requires external support at times to maintain balance/safety- possibly due to medications - ativan.                             Pertinent Vitals/Pain Pain Assessment: No/denies pain    Home Living Family/patient expects to be discharged to:: Private residence Living Arrangements: Spouse/significant other Available Help at Discharge:  (spouse works) Type of Home: House Home Access: Stairs to enter Entrance Stairs-Rails: Right Technical brewer of Steps: [place]: Two level;Bed/bath upstairs Home Equipment: Cane - single point      Prior Function Level of Independence: Independent               Hand Dominance        Extremity/Trunk Assessment   Upper Extremity Assessment: Defer to OT evaluation           Lower Extremity Assessment: Generalized weakness;Difficult to assess due to impaired cognition  Communication   Communication: No difficulties  Cognition Arousal/Alertness: Lethargic;Suspect due to medications (Given ativan earlier in AM for nausea.) Behavior During Therapy: [facility] for tasks assessed/performed Overall Cognitive Status: Impaired/Different from baseline Area of Impairment:  Orientation;Safety/judgement;Problem solving Orientation Level: Disoriented to;Time;Situation       Safety/Judgement: Decreased awareness of safety;Decreased awareness of deficits   Problem Solving: Slow processing;Requires verbal cues General Comments: pt with delayed processing and response time. "is that popcorn over there?" "it was the popcorn that started all this, that darn microwave."    General Comments      Exercises        Assessment/Plan    PT Assessment Patient needs continued PT services  PT Diagnosis Difficulty walking;Generalized weakness;Altered mental status   PT Problem List Decreased strength;Cardiopulmonary status limiting activity;Decreased cognition;Decreased balance;Decreased mobility;Decreased safety awareness  PT Treatment Interventions Balance training;Gait training;Patient/family education;Functional mobility training;Therapeutic activities;Therapeutic exercise;Stair training   PT Goals (Current goals can be found in the Care Plan section) Acute Rehab PT Goals Patient Stated Goal: none stated PT Goal Formulation: Patient unable to participate in goal setting Time For Goal Achievement: 11/05/14 Potential to Achieve Goals: Fair    Frequency Min 3X/week   Barriers to discharge Decreased caregiver support Spouse works during the day    Co-evaluation               End of Session Equipment Utilized During Treatment: Gait belt Activity Tolerance: Patient limited by lethargy Patient left: Other (comment);with call bell/phone within reach [facility]. Tech and RN made aware of situation and need to check on pt.) Nurse Communication: Mobility status;Other (comment) (decreased level of arousal; incontinent of urine/stool.)         Time: 9774-1423 PT Time Calculation (min) (ACUTE ONLY): 24 min   Charges:   PT Evaluation $Initial PT Evaluation Tier I: 1 Procedure PT Treatments $Therapeutic Activity: 8-22 mins   PT G Codes:        Becca Bayne A  Korvin Valentine 10/14/2014, 10:58 AM Wray Kearns, PT, DPT [phone]
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
## 2014-10-22 NOTE — Progress Notes (Signed)
PROGRESS NOTE    Madeline Dyer HYQ:[id number] DOB: 10-Jun-1951 DOA: 11/09/2014 PCP: Tammi Sou, MD  Primary Gastroenterologist: Dr. Silvano Rusk Primary Urologist: Dr. Risa Grill.  HPI/Brief narrative 64 year old female with history of GERD, PUD, IBS, status post cholecystectomy, fibromyalgia, HLD, hypothyroid, status post bioprosthetic aortic valve replacement, right hydronephrosis status post stent, fatty liver, chronic diarrhea, presented to [facility] ED on 10/14/13 with approximately 4 week history of epigastric abdominal pain, intermittent daily nonbloody emesis or retching, poor appetite, 5-6 pounds weight loss, ongoing chronic diarrhea but with streaks of blood and? Occasional black stools and generalized weakness. She attempted to see GI outpatient but no early appointments were available. Admitted for further evaluation and management. [facility] consulted, endoscopy significant for itchy gastritis, patient had low-grade fever, blood culture growing enterococcus.   Assessment/Plan:   enterococcus bacteremia - Patient had low-grade fever, so septic workup was done including negative urinalysis 2, negative chest x-ray 2, venous Dopplers negative for DVT, findings were significant for bacteremia growing enterococcus(has low-grade fever, and night sweats) - Patient was started on IV vancomycin 5/11. - Cultures 5/10 growing enterococcus, sensitive to ampicillin and vancomycin. - for  TEE today to rule out endocarditis - ID consult appreciated.  Nausea, vomiting, abdominal pain - Initially felt to be secondary to NSAID-induced gastritis or PUD -GI was consulted and managed conservatively. Despite this she continued to be symptomatic and had some blood in stools and dropped hemoglobin by approximately 2 g. She then underwent EGD and colonoscopy, EGD showing patchy gastritis - GI feels her presentation is mostly from IBS and are making medication changes.  - H pylori IgG:  Negative  Chronic diarrhea with blood streaks - As per GI, likely related to IBS - Started on Imodium. Monitor - Rotavirus: Not detected - C. difficile PCR negative. - Giardia screen: Negative - Cryptosporidium screen: Negative - Stool culture negative but showed decreased normal flora. started on  florastor. - On colestipol  Low-grade fevers -  Please see above discussion regarding enterococcus bacteremia  Acute blood loss anemia - As indicated above, patient has dropped hemoglobin by approximately 2 g since admission which may be a combination of dilutional and blood loss from GI bleed. - Stable.   Dehydration with hyponatremia - Secondary to GI losses - Improved after IV hydration  Hypokalemia - Repleted,   Hypothyroid - Continue levothyroxin - TSH: 7.41. May be related to poor absorption in the context of GI issues. - Clinically euthyroid. - Follow TSH in 4-6 weeks.  Hyperlipidemia - Continue statins  Non-anion gap metabolic acidosis - Secondary to GI losses - Resolved  Status post bioprosthetic aortic valve replacement - Patient on atenolol 25 MG daily-unclear indication-? HTN - Soft blood pressures may be related to intravascular volume depletion - Holding parameters for atenolol - Hydrate with IV fluids and monitor closely. - please see discussion above regarding enterococcus bacteremia  Hypotension - Not entirely clear why she is on atenolol. Had symptomatic low blood pressure with systolic in the 95M. DC atenolol. IV normal saline bolus and maintenance fluid. Follow clinically  Night sweats - Patient states that she's been having night sweats regularly for years dating back to 2011. Denies cough.     Code Status: Full Family Communication: None at bedside Disposition Plan: DC home when medically stable, PT consult pending   Consultants:  [facility]  Infectious disease  Procedures: Colonoscopy 10/10/2014: rectal polyp removed, o/w normal to TI.  Random biopsies to r/o microscopic colitis.  EGD 10/13/2014: benign appearing  gastric polyps, patchy gastritis (erythema, subepithelial heme, gastritis. Biopsies obtained and pending.  Antibiotics:  None   Subjective: No nausea or vomiting since yesterday. No further diarrhea. Complaints of chronic soaking sweats at night. . No cough, dyspnea, dysuria or urinary frequency.   Objective: Filed Vitals:   10/21/14 2049 10/13/2014 0457 10/10/2014 1338 10/21/2014 1435  BP: 103/67 91/57 110/62 111/88  Pulse: 89 85    Temp: 99.1 F (37.3 C) 100.1 F (37.8 C) 98.9 F (37.2 C)   TempSrc: Oral Oral Oral   Resp: 17 16 38 26  Height:      Weight:      SpO2: 95% 100% 89% 97%    Intake/Output Summary (Last 24 hours) at 11/07/2014 1455 Last data filed at 10/26/2014 0800  Gross per 24 hour  Intake    240 ml  Output      0 ml  Net    240 ml   Filed Weights   10/16/14 0144  Weight: 88.8 kg (195 lb 12.3 oz)     Exam:  General exam: Pleasant middle-aged female seen ambulating comfortably in the room. Oral mucosa with borderline hydration. Respiratory system: Clear. No increased work of breathing. Cardiovascular system: S1 & S2 heard, RRR. No JVD, has SEM murmurs. Gastrointestinal system: Abdomen is nondistended, soft and nontender. Normal bowel sounds heard. Central nervous system: Alert and oriented. No focal neurological deficits. Extremities: Symmetric 5 x 5 power. Minimal pedal edema and left lower extremity.   Data Reviewed: Basic Metabolic Panel:  Recent Labs Lab 11/08/2014 2233  10/17/14 0452 11/03/2014 0451 10/19/14 0600 10/21/14 0457 10/20/2014 0310  NA  --   < > 134* 135 137 136 135  K  --   < > 4.1 3.8 3.7 2.9* 3.6  CL  --   < > 104 102 101 101 99*  CO2  --   < > 23 24 24 26 25   GLUCOSE  --   < > 103* 93 80 143* 92  BUN  --   < > <5* <5* 5* <5* <5*  CREATININE  --   < > 0.80 0.88 0.83 0.81 0.88  CALCIUM  --   < > 8.0* 8.4* 8.2* 7.9* 7.9*  MG 1.5*  --   --   --   --   --   --    < > = values in this interval not displayed. Liver Function Tests:  Recent Labs Lab 11/09/2014 2105 10/17/14 0452  AST 51* 31  ALT 53 33  ALKPHOS 223* 195*  BILITOT 1.7* 1.4*  PROT 6.8 5.4*  ALBUMIN 2.7* 2.0*    Recent Labs Lab 10/10/2014 2105  LIPASE 62*   No results for input(s): AMMONIA in the last 168 hours. CBC:  Recent Labs Lab 10/30/2014 2105  10/17/14 1651 11/07/2014 0451 10/19/14 0600 10/21/14 0457 10/22/14 0310  WBC 12.7*  < > 12.7* 14.2* 11.6* 12.6* 10.8*  NEUTROABS 10.1*  --   --   --   --   --   --   HGB 11.5*  < > 10.1* 10.2* 10.1* 9.4* 9.2*  HCT 33.4*  < > 30.5* 30.9* 31.2* 28.5* 28.6*  MCV 84.1  < > 88.2 87.5 87.2 86.1 86.7  PLT 266  < > 260 243 231 223 214  < > = values in this interval not displayed. Cardiac Enzymes: No results for input(s): CKTOTAL, CKMB, CKMBINDEX, TROPONINI in the last 168 hours. BNP (last 3 results) No results for input(s): PROBNP in  the last 8760 hours. CBG: No results for input(s): GLUCAP in the last 168 hours.  Recent Results (from the past 240 hour(s))  Stool culture     Status: None   Collection Time: 10/14/14  3:52 PM  Result Value Ref Range Status   Organism ID, Bacteria No Salmonella,Shigella,Campylobacter,Yersinia,or  Final   Organism ID, Bacteria No E.coli 0157:H7 isolated.  Final    Comment: Reduced Normal Flora present  Giardia/cryptosporidium (EIA)     Status: None   Collection Time: 10/14/14  3:52 PM  Result Value Ref Range Status   Giardia Screen (EIA) NEGATIVE  Final   Cryptosporidium Screen (EIA) NEGATIVE  Final  Clostridium Difficile by PCR     Status: None   Collection Time: 10/14/14  3:52 PM  Result Value Ref Range Status   C difficile by pcr Not Detected Not Detected Final    Comment: This test is for use only with liquid or soft stools; performance characteristics of other clinical specimen types have not been established.   This assay was performed by Cepheid GeneXpert(R) PCR. The performance  characteristics of this assay have been determined by [facility]. Performance characteristics refer to the analytical performance of the test.   Stool culture     Status: None   Collection Time: 10/17/14  1:23 AM  Result Value Ref Range Status   Specimen Description STOOL  Final   Special Requests NONE  Final   Culture   Final    NO SALMONELLA, SHIGELLA, CAMPYLOBACTER, YERSINIA, OR E.COLI 0157:H7 ISOLATED Note: REDUCED NORMAL FLORA PRESENT Performed at [facility]    Report Status 10/21/2014 FINAL  Final  Culture, blood (routine x 2)     Status: None   Collection Time: 10/19/14  8:25 PM  Result Value Ref Range Status   Specimen Description BLOOD LEFT HAND  Final   Special Requests BOTTLES DRAWN AEROBIC AND ANAEROBIC 10ML EACH  Final   Culture   Final    ENTEROCOCCUS SPECIES Note: SUSCEPTIBILITIES PERFORMED ON PREVIOUS CULTURE WITHIN THE LAST 5 DAYS. Note: Gram Stain Report Called to,Read Back By and Verified With: ANTOINETTE F BY INGRAM A 10/20/14 1148AM Performed at [facility]    Report Status 11/08/2014 FINAL  Final  Culture, blood (routine x 2)     Status: None   Collection Time: 10/19/14  8:32 PM  Result Value Ref Range Status   Specimen Description BLOOD LEFT HAND  Final   Special Requests   Final    BOTTLES DRAWN AEROBIC AND ANAEROBIC 10MLBLUE 8ML RED   Culture   Final    ENTEROCOCCUS SPECIES Note: Gram Stain Report Called to,Read Back By and Verified With: ANTOINETTE F BY INGRAM A 10/20/14 1148AM Performed at [facility]    Report Status 10/19/2014 FINAL  Final   Organism ID, Bacteria ENTEROCOCCUS SPECIES  Final      Susceptibility   Enterococcus species - MIC*    AMPICILLIN <=2 SENSITIVE Sensitive     VANCOMYCIN 1 SENSITIVE Sensitive     * ENTEROCOCCUS SPECIES  Culture, Urine     Status: None   Collection Time: 10/20/14  4:10 AM  Result Value Ref Range Status   Specimen Description URINE, CLEAN CATCH  Final   Special  Requests NONE  Final   Colony Count   Final    20,OOO COLONIES/ML Performed at [facility]    Culture   Final    Multiple bacterial morphotypes present, none predominant. Suggest appropriate  recollection if clinically indicated. Performed at [facility]    Report Status 10/21/2014 FINAL  Final        Studies: No results found.      Scheduled Meds: . atorvastatin  20 mg Oral QPM  . colestipol  5 g Oral Q supper  . diclofenac sodium  2 g Topical QID  . diphenoxylate-atropine  1 tablet Oral TID AC  . escitalopram  20 mg Oral Daily  . levothyroxine  125 mcg Oral QAC breakfast  . liver oil-zinc oxide   Topical TID  . ondansetron  8 mg Intravenous Q6H  . pantoprazole  40 mg Oral Daily  . sodium chloride  500 mL Intravenous Once  . vancomycin  1,000 mg Intravenous Q12H   Continuous Infusions: . sodium chloride 75 mL/hr at 10/21/14 0600  . sodium chloride 500 mL (10/27/2014 1347)    Principal Problem:   Nausea vomiting and diarrhea Active Problems:   Metabolic acidosis, NAG, bicarbonate losses   Dehydration with hyponatremia   Hypokalemia due to loss of potassium   Hypokalemia   Acute posthemorrhagic anemia   Multiple gastric polyps   Gastritis and gastroduodenitis   Arterial hypotension   Pyrexia    Time spent: 30 minutes    Paytience Bures, MD [facility] Pager [phone]  If 7PM-7AM, please contact night-coverage [email or web address] Password [email or web address] 10/19/2014, 2:55 PM    LOS: 7 days

## 2014-10-22 NOTE — Progress Notes (Signed)
Quick Note:  Reviewed w/ patient at hospital No letter and no recall changes ______
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
## 2014-10-22 NOTE — Progress Notes (Signed)
  Echocardiogram Echocardiogram Transesophageal has been performed.  Madeline Dyer M 11/04/2014, 3:35 PM

## 2014-10-23 DIAGNOSIS — I330 Acute and subacute infective endocarditis: Secondary | ICD-10-CM

## 2014-10-23 MED ORDER — GENTAMICIN IN SALINE 1.6-0.9 MG/ML-% IV SOLN
80.0000 mg | Freq: Three times a day (TID) | INTRAVENOUS | Status: DC
  Filled 2014-10-23 (×2): qty 50

## 2014-10-25 ENCOUNTER — Encounter (HOSPITAL_COMMUNITY): Payer: Self-pay | Admitting: Internal Medicine

## 2014-10-25 LAB — CULTURE, BLOOD (ROUTINE X 2)

## 2014-10-26 ENCOUNTER — Encounter (HOSPITAL_COMMUNITY): Payer: Self-pay | Admitting: Internal Medicine

## 2014-11-05 ENCOUNTER — Encounter (HOSPITAL_COMMUNITY): Payer: Self-pay | Admitting: Internal Medicine

## 2014-11-05 ENCOUNTER — Ambulatory Visit: Payer: 59 | Admitting: Physician Assistant

## 2014-11-05 NOTE — Addendum Note (Signed)
Addendum  created 11/05/14 0955 by Duane Boston, MD   Modules edited: Anesthesia Events

## 2014-11-10 NOTE — Progress Notes (Addendum)
Patient has been very anxious with labored breathing (on occasions) O2 sats 96% on RA.  She had a couple of urinary incontinent episodes last night trying to get to the bathroom.  Encouraged patient to use BSC with success.  Patient has c/o not feeling well but has been unable to express exactly what is wrong.  She denies pain, some nausea but refused last offer for Phenergan.  Offered other suggestions to no avail.  She just states that she wants to go home.

## 2014-11-10 NOTE — Progress Notes (Signed)
This patient has ben released via [place] referral [phone], spoke with Murlean Caller at [phone].

## 2014-11-10 NOTE — Progress Notes (Signed)
   11/05/14 0700  Clinical Encounter Type  Visited With Family;Health care provider  Visit Type Initial;Death  Spiritual Encounters  Spiritual Needs Grief support  Stress Factors  Family Stress Factors Loss   Chaplain was paged to patient's room at 7:02 AM. Chaplain was notified over the phone that the patient had passed away. When chaplain arrived, patient was updated on the situation by the [facility]. AC explained that a code blue was called for the patient at around 5:30AM and was not able to be resuscitated. Patient was made aware that the patient's husband was at bedside and may need support. Chaplain introduced himself to patient's husband. Patient's husband was grieving but did not need much support at this time and wished to be alone. Patient's husband also said that he was having a particularly hard time because his 12th anniversary with the patient is coming up soon. Patient's husband stated that he had no funeral arrangements made yet but knows that the patient wished to be cremated. AC was to talk about funeral arrangements further with patient's husband. Patient's husband did not anticipate any other family coming to the hospital today but does have a son who is flying in from out of state. Page Elodia Florence chaplain if patient's family needs further grief support.  Kerrion Kemppainen, Claudius Sis, Chaplain  7:52 AM
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
## 2014-11-10 NOTE — Progress Notes (Signed)
During the night, patient's left hand iv kept beeping while she would lay on her left hand sleeping.  Left hand iv was wrapped in kerlex to minimize beeping.  Staff went in room around 0415 - 0430 this am.  Patient was asleep.  She stirred a little but remained asleep.  RN came in around 0615 with 0600 meds, found patient unresponsive.  RN checked carotid pulse, no pulse found. Patient body warm to touch. Charge nurse called to room immediately.  Code blue called at [place].  Compressions started. All staff came to assist.  Patient expired at 0641 on 10/30/2014.   Patient's husband notified.  Husband arrived to hospital around 0720.  He expressed no tests or further procedures for spouse.  He wishes cremation.  Body has been released via [place] referral # [phone].  Body is ready for morgue.  Husband spoke with staff (Dr., RN, Charge, [facility], and Chaplain - husband wished to be alone with wife at the time).  Husband tearful, staff attending to his needs and concerns.  Husband given number to patient placement and informed that once him and the son discuss funeral arrangements he will contact patient placement.  Husband gathered wife's belongings in bag and took it with him.

## 2014-11-10 NOTE — Code Documentation (Signed)
  Patient Name: Madeline Dyer   MRN: [id number]   Date of Birth/ Sex: 09-09-1950 , female      Admission Date: 10/12/2014  Attending Provider: Albertine Patricia, MD  Primary Diagnosis: Dehydration [E86.0] Hypokalemia [Q82.5] Metabolic acidosis [O03.7] Nausea vomiting and diarrhea [R11.2, R19.7]   Indication: Pt was in her usual state of health until this AM, when she was noted to be unresponsive and asystole. Code blue was subsequently called. At the time of arrival on scene, ACLS protocol was underway.   Technical Description:  - CPR performance duration:  21  minutes  - Was defibrillation or cardioversion used? No   - Was external pacer placed? No  - Was patient intubated pre/post CPR? Yes   Medications Administered: Y = Yes; Blank = No Amiodarone    Atropine    Calcium    Epinephrine    Lidocaine    Magnesium    Norepinephrine    Phenylephrine    Sodium bicarbonate    Vasopressin    Other    Post CPR evaluation:  - Final Status - Was patient successfully resuscitated ? No   Miscellaneous Information:  - Time of death:  6:41  AM  - Primary team notified?  Yes  - Family Notified? Yes     Luan Moore, MD   2014-11-10, 6:44 AM

## 2014-11-10 DEATH — deceased

## 2014-11-11 ENCOUNTER — Ambulatory Visit: Payer: BC Managed Care – PPO | Admitting: Family Medicine

## 2016-07-25 IMAGING — DX DG ABDOMEN ACUTE W/ 1V CHEST
3 series · 3 of 3 positions shown · non-contrast
Comparison: CT 10/04/2014, ultrasound 10/04/2014, abdomen series
10/04/2014.

CLINICAL DATA: Epigastric pain.  Nausea and vomiting.

EXAM:
DG ABDOMEN ACUTE W/ 1V CHEST

[chest pa]
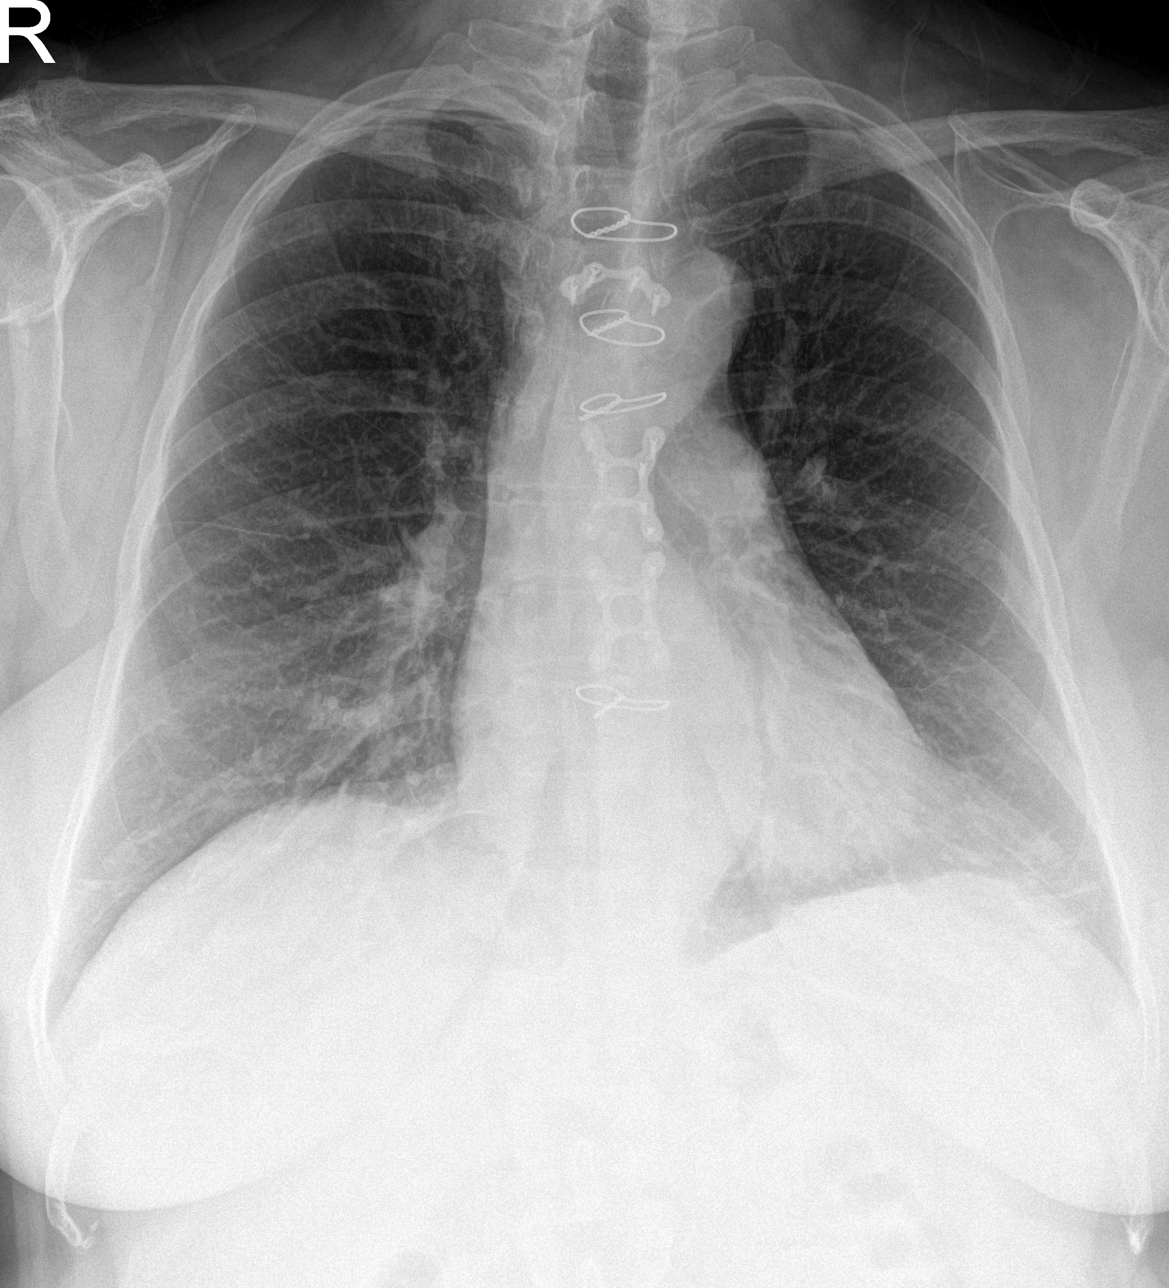

[abdomen erect]
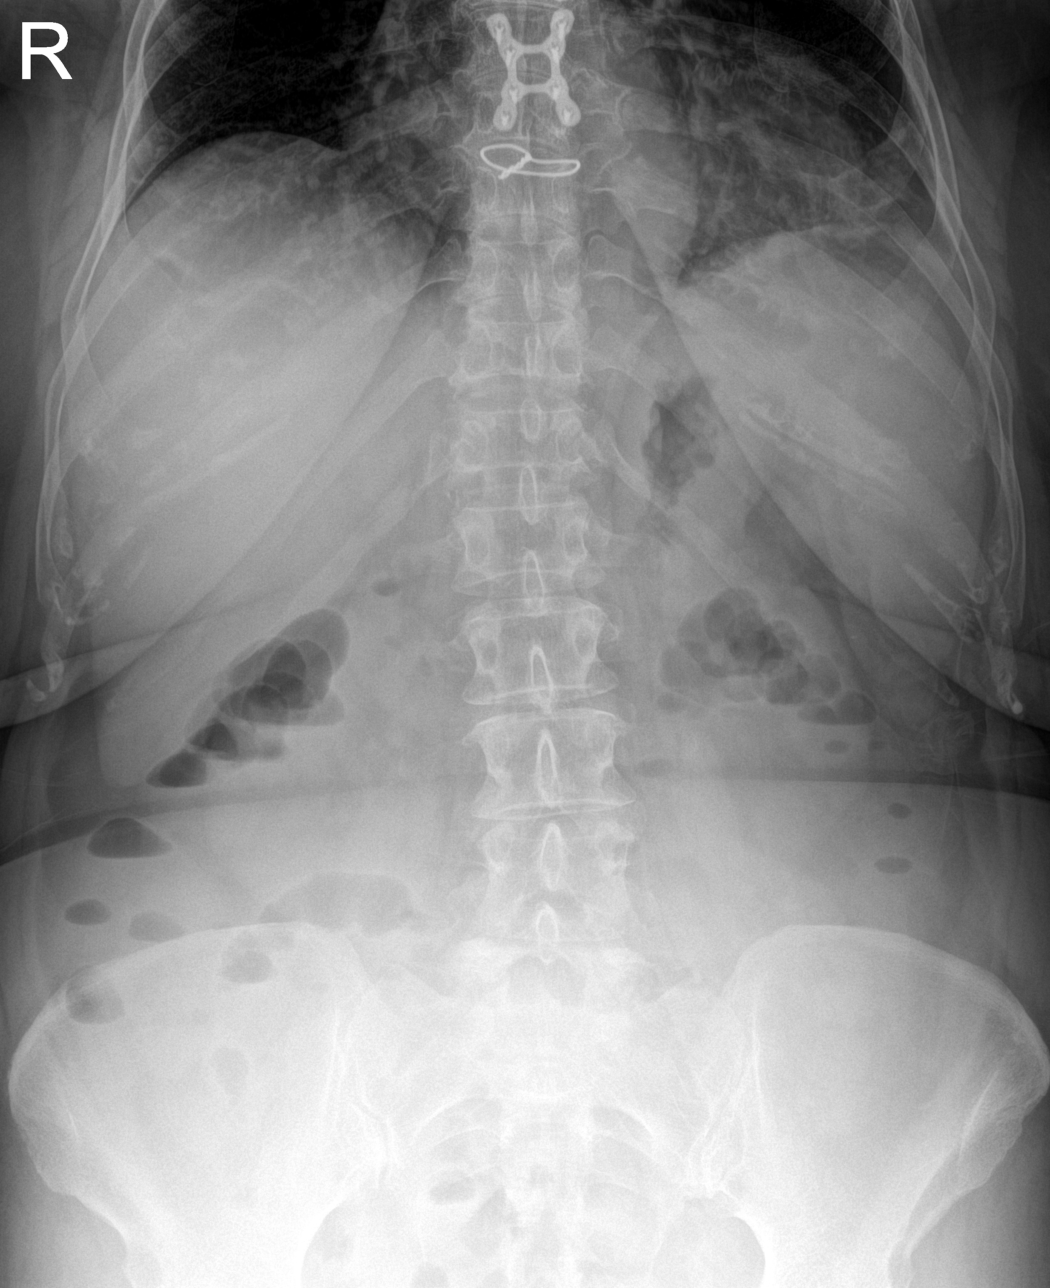

[abdomen supine]
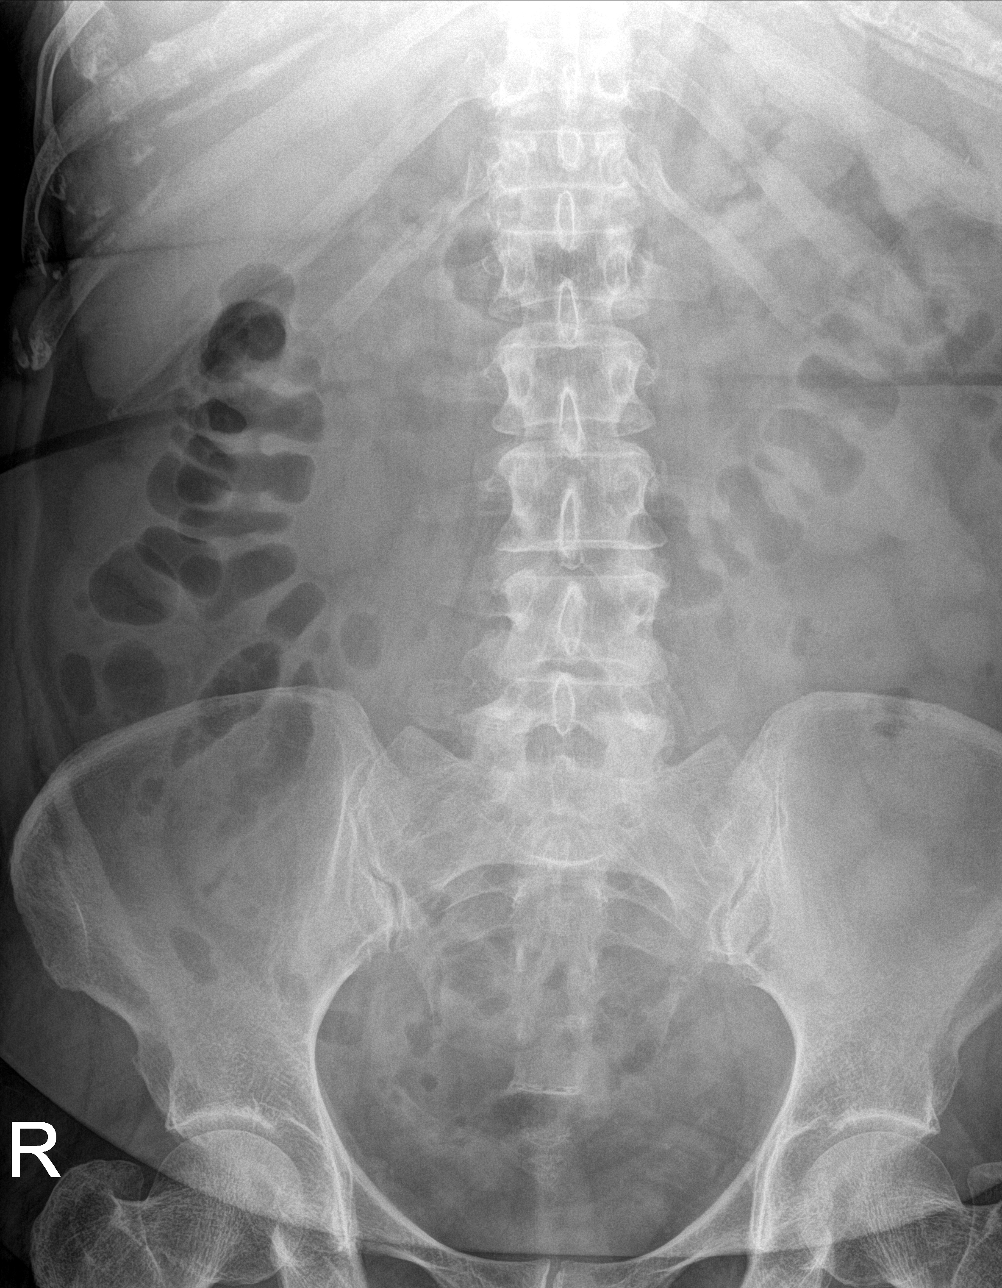

[3 of 3 positions shown; findings below may reference images not displayed]

FINDINGS: Soft tissue structures are unremarkable. Scattered air-fluid levels
are noted in the colon. Diarrheal illness could present this
fashion. No bowel distention or free air . No acute cardiopulmonary
disease. Prior median sternotomy.
IMPRESSION: 1. Scattered air-fluid levels are noted in the colon. A diarrheal
illness could present in this fashion. No bowel distention or free
air.

2. Prior median sternotomy. Heart size stable. No acute
cardiopulmonary disease.
